# Patient Record
Sex: Male | Born: 1985 | Race: White | Hispanic: No | Marital: Married | Smoking: Never smoker
Health system: Southern US, Community
[De-identification: ages and names within clinical notes are randomized; demographics above are authoritative.]

## PROBLEM LIST (undated history)

## (undated) DIAGNOSIS — G40909 Epilepsy, unspecified, not intractable, without status epilepticus: Secondary | ICD-10-CM

## (undated) HISTORY — PX: WISDOM TOOTH EXTRACTION: SHX21

---

## 2003-10-04 ENCOUNTER — Other Ambulatory Visit: Payer: Self-pay

## 2005-02-09 ENCOUNTER — Emergency Department: Payer: Self-pay | Admitting: Emergency Medicine

## 2005-02-09 ENCOUNTER — Other Ambulatory Visit: Payer: Self-pay

## 2005-05-08 ENCOUNTER — Emergency Department: Payer: Self-pay | Admitting: Unknown Physician Specialty

## 2005-07-05 ENCOUNTER — Emergency Department: Payer: Self-pay | Admitting: Emergency Medicine

## 2005-08-14 ENCOUNTER — Emergency Department: Payer: Self-pay | Admitting: Emergency Medicine

## 2005-09-19 ENCOUNTER — Emergency Department: Payer: Self-pay | Admitting: Emergency Medicine

## 2005-10-25 ENCOUNTER — Emergency Department: Payer: Self-pay | Admitting: Emergency Medicine

## 2006-02-09 ENCOUNTER — Emergency Department: Payer: Self-pay | Admitting: Emergency Medicine

## 2006-03-23 ENCOUNTER — Emergency Department: Payer: Self-pay | Admitting: Emergency Medicine

## 2006-05-08 ENCOUNTER — Emergency Department: Payer: Self-pay | Admitting: Emergency Medicine

## 2006-05-23 ENCOUNTER — Emergency Department: Payer: Self-pay | Admitting: Emergency Medicine

## 2006-10-02 ENCOUNTER — Emergency Department: Payer: Self-pay | Admitting: Emergency Medicine

## 2007-06-05 ENCOUNTER — Emergency Department: Payer: Self-pay | Admitting: Emergency Medicine

## 2007-11-18 ENCOUNTER — Emergency Department: Payer: Self-pay | Admitting: Emergency Medicine

## 2008-09-18 ENCOUNTER — Emergency Department: Payer: Self-pay | Admitting: Emergency Medicine

## 2010-08-02 ENCOUNTER — Emergency Department: Payer: Self-pay | Admitting: Emergency Medicine

## 2011-02-01 ENCOUNTER — Emergency Department: Payer: Self-pay | Admitting: Emergency Medicine

## 2013-07-09 ENCOUNTER — Emergency Department (HOSPITAL_COMMUNITY): Payer: Medicaid Other

## 2013-07-09 ENCOUNTER — Emergency Department: Payer: Self-pay | Admitting: Internal Medicine

## 2013-07-09 ENCOUNTER — Emergency Department (HOSPITAL_COMMUNITY)
Admission: EM | Admit: 2013-07-09 | Discharge: 2013-07-10 | Disposition: A | Discharge status: Home / Self Care | Payer: Medicaid Other | Attending: Emergency Medicine | Admitting: Emergency Medicine

## 2013-07-09 ENCOUNTER — Encounter (HOSPITAL_COMMUNITY): Payer: Self-pay | Admitting: Emergency Medicine

## 2013-07-09 DIAGNOSIS — R590 Localized enlarged lymph nodes: Secondary | ICD-10-CM

## 2013-07-09 DIAGNOSIS — R0789 Other chest pain: Secondary | ICD-10-CM | POA: Insufficient documentation

## 2013-07-09 DIAGNOSIS — R599 Enlarged lymph nodes, unspecified: Secondary | ICD-10-CM | POA: Insufficient documentation

## 2013-07-09 DIAGNOSIS — J111 Influenza due to unidentified influenza virus with other respiratory manifestations: Secondary | ICD-10-CM

## 2013-07-09 DIAGNOSIS — R69 Illness, unspecified: Secondary | ICD-10-CM

## 2013-07-09 DIAGNOSIS — R0602 Shortness of breath: Secondary | ICD-10-CM | POA: Insufficient documentation

## 2013-07-09 DIAGNOSIS — H9209 Otalgia, unspecified ear: Secondary | ICD-10-CM | POA: Insufficient documentation

## 2013-07-09 DIAGNOSIS — R062 Wheezing: Secondary | ICD-10-CM | POA: Insufficient documentation

## 2013-07-09 LAB — CBC WITH DIFFERENTIAL/PLATELET
Basophils Absolute: 0 10*3/uL (ref 0.0–0.1)
Basophils Relative: 0 % (ref 0–1)
Eosinophils Absolute: 0 10*3/uL (ref 0.0–0.7)
Eosinophils Relative: 1 % (ref 0–5)
HEMATOCRIT: 39.8 % (ref 39.0–52.0)
Hemoglobin: 14.2 g/dL (ref 13.0–17.0)
LYMPHS PCT: 13 % (ref 12–46)
Lymphs Abs: 1 10*3/uL (ref 0.7–4.0)
MCH: 31.9 pg (ref 26.0–34.0)
MCHC: 35.7 g/dL (ref 30.0–36.0)
MCV: 89.4 fL (ref 78.0–100.0)
MONO ABS: 0.9 10*3/uL (ref 0.1–1.0)
Monocytes Relative: 11 % (ref 3–12)
NEUTROS ABS: 5.9 10*3/uL (ref 1.7–7.7)
NEUTROS PCT: 75 % (ref 43–77)
Platelets: 166 10*3/uL (ref 150–400)
RBC: 4.45 MIL/uL (ref 4.22–5.81)
RDW: 12.7 % (ref 11.5–15.5)
WBC: 7.8 10*3/uL (ref 4.0–10.5)

## 2013-07-09 LAB — BASIC METABOLIC PANEL
BUN: 10 mg/dL (ref 6–23)
CHLORIDE: 99 meq/L (ref 96–112)
CO2: 25 meq/L (ref 19–32)
CREATININE: 0.89 mg/dL (ref 0.50–1.35)
Calcium: 9.2 mg/dL (ref 8.4–10.5)
GFR calc non Af Amer: >90 mL/min (ref >90)
Glucose, Bld: 101 mg/dL — ABNORMAL HIGH (ref 70–99)
Potassium: 3.9 mEq/L (ref 3.7–5.3)
Sodium: 137 mEq/L (ref 137–147)

## 2013-07-09 LAB — RAPID STREP SCREEN (MED CTR MEBANE ONLY): STREPTOCOCCUS, GROUP A SCREEN (DIRECT): NEGATIVE

## 2013-07-09 MED ORDER — ONDANSETRON HCL 4 MG/2ML IJ SOLN
4.0000 mg | Freq: Once | INTRAMUSCULAR | Status: AC
  Administered 2013-07-09: 4 mg via INTRAVENOUS
  Filled 2013-07-09: qty 2

## 2013-07-09 MED ORDER — IBUPROFEN 400 MG PO TABS
800.0000 mg | ORAL_TABLET | Freq: Once | ORAL | Status: AC
  Administered 2013-07-09: 800 mg via ORAL
  Filled 2013-07-09: qty 2

## 2013-07-09 MED ORDER — SODIUM CHLORIDE 0.9 % IV BOLUS (SEPSIS)
2000.0000 mL | Freq: Once | INTRAVENOUS | Status: AC
  Administered 2013-07-09: 2000 mL via INTRAVENOUS

## 2013-07-09 NOTE — ED Provider Notes (Signed)
CSN: [id number]     Arrival date & time 07/09/13  [id number] History   This chart was scribed for non-physician practitioner Dierdre ForthHannah Tais Koestner, PA-C working with Flint MelterElliott L Wentz, MD by Donne Anonayla Curran, ED Scribe. This patient was seen in room TR07C/TR07C and the patient's care was started at 2119.   First MD Initiated Contact with Patient 07/09/13 2119     Chief Complaint  Patient presents with  . swollen lymph nodes neck    The history is provided by the patient. No language interpreter was used.   HPI Comments: Chris Hart is a 28 y.o. male who presents to the Emergency Department complaining of 6 days of influenza-like illness and gradually swelling lymph nodes in his neck that worsened today. He was seen at [facility] this morning and states that they did not do anything there. He reports associated cough, congestion, fever (102), ear pain, pain with swallowing, mild dizziness, and nausea.  He denies vomiting, diarrhea, syncope, dysuria.  He has tried ibuprofen and Tylenol with moderate relief. He denies sore throat, vomiting, or any other pain. He denies exposure to sick individuals. He did not receive a flu shot this year. He denies any other medical conditions.  History reviewed. No pertinent past medical history. History reviewed. No pertinent past surgical history. No family history on file. History  Substance Use Topics  . Smoking status: Never Smoker   . Smokeless tobacco: Not on file  . Alcohol Use: Yes    Review of Systems  Constitutional: Positive for fatigue. Negative for fever, chills and appetite change.  HENT: Positive for congestion, ear pain, postnasal drip, rhinorrhea and sinus pressure. Negative for ear discharge, mouth sores and sore throat.   Eyes: Negative for visual disturbance.  Respiratory: Positive for cough, chest tightness, shortness of breath and wheezing. Negative for stridor.   Cardiovascular: Negative for chest pain, palpitations and leg  swelling.  Gastrointestinal: Positive for nausea. Negative for vomiting, abdominal pain and diarrhea.  Genitourinary: Negative for dysuria, urgency, frequency and hematuria.  Musculoskeletal: Negative for arthralgias, back pain, myalgias and neck stiffness.  Skin: Negative for rash.  Neurological: Positive for dizziness and headaches. Negative for syncope, light-headedness and numbness.  Hematological: Negative for adenopathy.  Psychiatric/Behavioral: The patient is not nervous/anxious.   All other systems reviewed and are negative.    Allergies  Bactrim  Home Medications   Current Outpatient Rx  Name  Route  Sig  Dispense  Refill  . acetaminophen (TYLENOL) 325 MG tablet   Oral   Take 650 mg by mouth every 6 (six) hours as needed for moderate pain.         Marland Kitchen. DM-Doxylamine-Acetaminophen 15-6.25-325 MG/15ML LIQD   Oral   Take 30 mLs by mouth daily as needed (for cold).         Marland Kitchen. ibuprofen (ADVIL,MOTRIN) 200 MG tablet   Oral   Take 200 mg by mouth every 6 (six) hours as needed for moderate pain.         Marland Kitchen. HYDROcodone-homatropine (HYCODAN) 5-1.5 MG/5ML syrup   Oral   Take 5 mLs by mouth every 6 (six) hours as needed for cough.   120 mL   0     BP 124/78  Pulse 85  Temp(Src) 98.6 F (37 C) (Oral)  Resp 20  Ht 6\' 1"  (1.854 m)  Wt 268 lb (121.564 kg)  BMI 35.37 kg/m2  SpO2 100%  Physical Exam  Nursing note and vitals reviewed. Constitutional: He is oriented to  person, place, and time. He appears well-developed and well-nourished. No distress.  HENT:  Head: Normocephalic and atraumatic.  Right Ear: Tympanic membrane, external ear and ear canal normal.  Left Ear: Tympanic membrane, external ear and ear canal normal.  Nose: Mucosal edema and rhinorrhea present. No epistaxis. Right sinus exhibits no maxillary sinus tenderness and no frontal sinus tenderness. Left sinus exhibits no maxillary sinus tenderness and no frontal sinus tenderness.  Mouth/Throat: Uvula is  midline and mucous membranes are normal. Mucous membranes are not pale and not cyanotic. No oropharyngeal exudate, posterior oropharyngeal edema, posterior oropharyngeal erythema or tonsillar abscesses.  TM's are erythematous, non bulging, no effusion. Tonsils enlarged, erythematous with open cripes but no exudate No tenderness to mastoid No tenderness to parotid glands Sublingual area soft without induration   Eyes: Conjunctivae are normal. Pupils are equal, round, and reactive to light.  Neck: Normal range of motion and full passive range of motion without pain.  No midline or paraspinal C spine tenderness  Cardiovascular: Normal rate, normal heart sounds and intact distal pulses.   No murmur heard. Regular rhythm, tachycardia, no murmur  Pulmonary/Chest: Effort normal and breath sounds normal. No stridor.  Clear and equal breath sounds  Abdominal: Soft. Bowel sounds are normal. He exhibits no distension and no mass. There is no tenderness. There is no rebound and no guarding.  Abdomen soft and nontender without palpable mass or bruit  Musculoskeletal: Normal range of motion.  Lymphadenopathy:       Head (right side): Submandibular, tonsillar and preauricular adenopathy present. No submental, no posterior auricular and no occipital adenopathy present.       Head (left side): Submandibular, tonsillar and preauricular adenopathy present. No submental, no posterior auricular and no occipital adenopathy present.    He has cervical adenopathy.       Right cervical: Superficial cervical adenopathy present. No deep cervical and no posterior cervical adenopathy present.      Left cervical: Superficial cervical adenopathy present. No deep cervical and no posterior cervical adenopathy present.    He has no axillary adenopathy.       Right: No supraclavicular adenopathy present.       Left: No supraclavicular adenopathy present.  Patient with significant lymphadenopathy in the submandibular,  tonsillar and preauricular lymph nodes Mild lymphadenopathy of the superficial cervical but no lymphadenopathy of the deep or posterior cervical No axillary or supraclavicular lymphadenopathy  Neurological: He is alert and oriented to person, place, and time.  Skin: Skin is warm and dry. No rash noted. He is not diaphoretic. No erythema.  Psychiatric: He has a normal mood and affect. His behavior is normal.    ED Course  Procedures (including critical care time)  DIAGNOSTIC STUDIES: Oxygen Saturation is 100% on RA, normal by my interpretation.    COORDINATION OF CARE: 9:34 PM Discussed treatment plan which includes consultation with Dr. Effie Shy with pt at bedside and pt agreed to plan.   9:44 PM Case discussed with Dr. Effie Shy, who recommended lymphoma workup.   Labs Review Labs Reviewed  BASIC METABOLIC PANEL - Abnormal; Notable for the following:    Glucose, Bld 101 (*)    All other components within normal limits  RAPID STREP SCREEN  CULTURE, GROUP A STREP  CBC WITH DIFFERENTIAL   Imaging Review Dg Chest 2 View  07/09/2013   CLINICAL DATA:  Cough  EXAM: CHEST  2 VIEW  COMPARISON:  None.  FINDINGS: Normal heart size and mediastinal contours. No acute infiltrate or  edema. No effusion or pneumothorax. No acute osseous findings.  IMPRESSION: Negative for pneumonia.   Electronically Signed   By: Tiburcio Pea M.D.   On: 07/09/2013 23:23    EKG Interpretation   None       MDM   1. Reactive cervical lymphadenopathy   2. Influenza-like illness      Orvel Cutsforth presents with influenza like illness and cervical adenopathy.  Patient with symptoms consistent with influenza.  Vitals are stable, fever. Concern for mild dehydration, but tolerating PO's.  Lungs are clear. Patient with tachycardia and not discrete lymphadenopathy.  Likely related to patient's viral illness but concern for lymphoma is present. Will obtain labs and chest x-ray.  Chest x-ray without evidence of  pneumonia, pneumothorax or pulmonary edema. Also of note patient does not have a widened mediastinum or hilar lymphadenopathy. CBC and CMP unremarkable and not indicative of lymphoma.  The patient understands that symptoms are greater than the recommended 24-48 hour window of treatment with Tamiflu.  Patient will be discharged with instructions to orally hydrate, rest, and use over-the-counter medications such as anti-inflammatories ibuprofen and Aleve for muscle aches and Tylenol for fever.  Patient will also be given a cough suppressant.   It has been determined that no acute conditions requiring further emergency intervention are present at this time. The patient/guardian have been advised of the diagnosis and plan. We have discussed signs and symptoms that warrant return to the ED, such as changes or worsening in symptoms.   Vital signs are stable at discharge.   BP 124/78  Pulse 85  Temp(Src) 98.6 F (37 C) (Oral)  Resp 20  Ht 6\' 1"  (1.854 m)  Wt 268 lb (121.564 kg)  BMI 35.37 kg/m2  SpO2 100%  Patient/guardian has voiced understanding and agreed to follow-up with the PCP or specialist.   I personally performed the services described in this documentation, which was scribed in my presence. The recorded information has been reviewed and is accurate.     Dahlia Client Aireal Slater, PA-C 07/10/13 [phone]

## 2013-07-09 NOTE — ED Notes (Signed)
X-ray called to inform of patient availability.

## 2013-07-09 NOTE — ED Notes (Signed)
The pt has had lymph nodes swollen in his neck since yesterday.  He was seen at [facility] earlier today and he reports that they di not do anything there.  Sl temp sleeping more than usual.  He had a cold last week

## 2013-07-09 NOTE — ED Notes (Signed)
IV access attempted x2 without success. Felipa Etherrence, RN asked to attempt IV access.

## 2013-07-10 MED ORDER — HYDROCODONE-HOMATROPINE 5-1.5 MG/5ML PO SYRP: 5.0000 mL | ORAL_SOLUTION | Freq: Four times a day (QID) | ORAL | Status: DC | PRN

## 2013-07-10 NOTE — Discharge Instructions (Signed)
1. Medications: mucinex, hycodan, usual home medications 2. Treatment: rest, drink plenty of fluids, take tylenol or ibuprofen for fever control 3. Follow Up: Please followup with your primary doctor for discussion of your diagnoses and further evaluation after today's visit; if you do not have a primary care doctor use the resource guide provided to find one;     Read the instructions below on reasons to return to the emergency department and to learn more about your diagnosis.  Use over the counter medications for symptomatic relief as we discussed (musinex as a decongestant, Tylenol for fever/pain, Motrin/Ibuprofen for muscle aches). If prescribed a cough suppressant during your visit, do not operate heavy machinery with in 5 hours of taking this medication. Followup with your primary care doctor in 4 days if your symptoms persist.  Your more than welcome to return to the emergency department if symptoms worsen or become concerning.  Influenza, Adult Influenza ("the flu") is a viral infection of the respiratory tract. It occurs more often in winter months because people spend more time in close contact with one another. Influenza can make you feel very sick. Influenza easily spreads from person to person (contagious). CAUSES  Influenza is caused by a virus that infects the respiratory tract. You can catch the virus by breathing in droplets from an infected person's cough or sneeze. You can also catch the virus by touching something that was recently contaminated with the virus and then touching your mouth, nose, or eyes. SYMPTOMS  Symptoms typically last 4 to 10 days and may include:  Fever.  Chills.  Headache, body aches, and muscle aches.  Sore throat.  Chest discomfort and cough.  Poor appetite.  Weakness or feeling tired.  Dizziness.  Nausea or vomiting. DIAGNOSIS  Diagnosis of influenza is often made based on your history and a physical exam. A nose or throat swab test can be  done to confirm the diagnosis. RISKS AND COMPLICATIONS You may be at risk for a more severe case of influenza if you smoke cigarettes, have diabetes, have chronic heart disease (such as heart failure) or lung disease (such as asthma), or if you have a weakened immune system. Elderly people and pregnant women are also at risk for more serious infections. The most common complication of influenza is a lung infection (pneumonia). Sometimes, this complication can require emergency medical care and may be life-threatening. PREVENTION  An annual influenza vaccination (flu shot) is the best way to avoid getting influenza. An annual flu shot is now routinely recommended for all adults in the [place] TREATMENT  In mild cases, influenza goes away on its own. Treatment is directed at relieving symptoms. For more severe cases, your caregiver may prescribe antiviral medicines to shorten the sickness. Antibiotic medicines are not effective, because the infection is caused by a virus, not by bacteria. HOME CARE INSTRUCTIONS  Only take over-the-counter or prescription medicines for pain, discomfort, or fever as directed by your caregiver.  Use a cool mist humidifier to make breathing easier.  Get plenty of rest until your temperature returns to normal. This usually takes 3 to 4 days.  Drink enough fluids to keep your urine clear or pale yellow.  Cover your mouth and nose when coughing or sneezing, and wash your hands well to avoid spreading the virus.  Stay home from work or school until your fever has been gone for at least 1 full day. SEEK MEDICAL CARE IF:   You have chest pain or a deep cough that  worsens or produces more mucus.  You have nausea, vomiting, or diarrhea. SEEK IMMEDIATE MEDICAL CARE IF:   You have difficulty breathing, shortness of breath, or your skin or nails turn bluish.  You have severe neck pain or stiffness.  You have a severe headache, facial pain, or earache.  You have a  worsening or recurring fever.  You have nausea or vomiting that cannot be controlled. MAKE SURE YOU:  Understand these instructions.  Will watch your condition.  Will get help right away if you are not doing well or get worse. Document Released: 06/09/2000 Document Revised: 12/12/2011 Document Reviewed: 09/11/2011 [facility] Patient Information 2014 [place], [place].   Emergency Department Resource Guide 1) Find a Doctor and Pay Out of Pocket Although you won't have to find out who is covered by your insurance plan, it is a good idea to ask around and get recommendations. You will then need to call the office and see if the doctor you have chosen will accept you as a new patient and what types of options they offer for patients who are self-pay. Some doctors offer discounts or will set up payment plans for their patients who do not have insurance, but you will need to ask so you aren't surprised when you get to your appointment.  2) Contact Your Local Health Department Not all health departments have doctors that can see patients for sick visits, but many do, so it is worth a call to see if yours does. If you don't know where your local health department is, you can check in your phone book. The CDC also has a tool to help you locate your state's health department, and many state websites also have listings of all of their local health departments.  3) Find a Walk-in Clinic If your illness is not likely to be very severe or complicated, you may want to try a walk in clinic. These are popping up all over the country in pharmacies, drugstores, and shopping centers. They're usually staffed by nurse practitioners or physician assistants that have been trained to treat common illnesses and complaints. They're usually fairly quick and inexpensive. However, if you have serious medical issues or chronic medical problems, these are probably not your best option.  No Primary Care Doctor: - Call Health  Connect at  [phone] - they can help you locate a primary care doctor that  accepts your insurance, provides certain services, etc. - Physician Referral Service- [phone]  Chronic Pain Problems: Organization         Address  Phone   Notes  [facility]  [phone] Patients need to be referred by their primary care doctor.   Medication Assistance: Organization         Address  Phone   Notes  [facility]ication [facility] [address] [place], [place] [id number] [phone] --Must be a resident of [place] [facility] [place] -- Must have NO insurance coverage whatsoever (no Medicaid/ Medicare, etc.) -- The pt. MUST have a primary care doctor that directs their care regularly and follows them in the community   MedAssist  [phone]   [employer]  [phone]    Agencies that provide inexpensive medical care: Organization         Address  Phone   Notes  [facility]  [phone]   [facility]    [phone]   [facility] [address] [place],  [place] [address] [phone]   [facility] [address], [place] [phone]   Planned Parenthood    [phone]   [facility]    [phone]   [facility] and [facility]  [address], [place] Phone:  [phone], Fax:  [phone] Hours of Operation:  9 am - 6 pm, M-F.  Also accepts Medicaid/Medicare and self-pay.  [facility]  [address], [place] Phone: [phone], Fax: [phone]. Hours of Operation:  8:30 am - 5:30 pm, M-F.  Also accepts Medicaid and self-pay.  [facility] [place] [address] [place] Phone: [phone]   [facility] [address] Natasha Bence [place], [place] [phone], Ext. [phone] Mondays & Thursdays: 7-9 AM.  First 15 patients are seen on a first come, first serve basis.     Medicaid-accepting [facility] Providers:  Organization         Address  Phone   Notes  [facility] [address], [place] [phone] Also accepts self-pay patients.  [facility] [address] Laurell Josephs [place], [place]  [phone]   [facility] [address], [place] [phone]   [facility] [address] [place] [phone]   Renaye Rakers [address], [place]   [phone] Only accepts [place] Access [place] patients after they have their name applied to their card.   Self-Pay (no insurance) in [facility]:  Organization         Address  Phone   Notes  Sickle Cell Patients, [facility] [address] [place], [place] [phone]   [facility] [address] [place], [place] [phone]   [facility] [place]  [address],  [phone]   [facility]/Dr. Osei-Bonsu  [address] [place] or [address], [place] [phone] Phone number for both [place] and [place] locations is the same.  [facility] and [facility] [address], [place] [phone]   [facility] [address] [place] or [address] [phone]   [facility] [address] [place] [phone], phone; [phone], fax Sees patients 1st and 3rd Saturday of every month.  Must not qualify for public or private insurance (i.e. Medicaid, Medicare, [place] Health Choice, Veterans' Benefits)  Household income should be no more than 200% of the poverty level The clinic cannot treat you if you are pregnant or think you are pregnant  Sexually transmitted diseases are not treated at the clinic.    Dental Care: Organization         Address  Phone  Notes  [facility]  Department of [facility] [address] [place], [place] [phone] Accepts children up to age 19 who are enrolled in [place] or [place] Health Choice; pregnant women with a Medicaid card; and children who have applied for Medicaid or [place] Health Choice, but were declined, whose parents can pay a reduced fee at time of service.  [facility] Department of [facility]  [address], [place] [phone] Accepts children up to age 20 who are enrolled in [place] or  Health Choice; pregnant  women with a Medicaid card; and children who have applied for Medicaid or [place] Health Choice, but were declined, whose parents can pay a reduced fee at time of service.  [place] [facility] Access PRO[facility]  [place] [phone] Patients are seen by appointment only. Walk-ins are not accepted. [place] will see patients 52 years of age and older. Monday - Tuesday (8am-5pm) Most Wednesdays (8:30-5pm) $30 per visit, cash only  [facility] Adult Dental Access PROGRAM  [address], [facility] [phone] Patients are seen by appointment only. Walk-ins are not accepted. [place] will see patients 43 years of age and older. One Wednesday Evening (Monthly: Volunteer Based).  $30 per visit, cash only  [place]  [phone] for adults; Children under age 61, call Graduate Pediatric Dentistry at [phone]. Children aged 48-14, please call [phone] to request a pediatric application.  Dental services are provided in all areas of dental care including fillings, crowns and bridges, complete and partial dentures, implants, gum treatment, root canals, and extractions. Preventive care is also provided. Treatment is provided to both adults and children. Patients are selected via a lottery and there is often a waiting list.   [facility] [address] [place]  [phone]  [email or web address]   [facility] [address] [place], [place] [phone], Ext. [phone] Second and Fourth Thursday of each month, opens at 6:30 AM; Clinic ends at 9 AM.  Patients are seen on a first-come first-served basis, and a limited number are seen during each clinic.   [facility]  [address] Hillard Danker [place], [place] [phone]   Eligibility Requirements You must have lived in [place], [place], or [place] counties for at least the last three months.   You cannot be eligible for state or federal sponsored [employer], including [employer], [place], or [employer].   You generally cannot be eligible for healthcare insurance through your employer.    How to apply: Eligibility screenings are held every Tuesday and Wednesday afternoon from 1:00 pm until 4:00 pm. You do not need an appointment for the interview!  [facility] [address], [place], [place]   [place]  [place] Department  [place]  [phone]    Behavioral Health Resources in the Community: Intensive Outpatient Programs Organization         Address  Phone  Notes  [place]. [address] [place], [place] [phone]   [facility] [address] [place], [place]   ADS: Alcohol & Drug Svcs [address] [place], [place]   [place] [address] [place] [address],  [place], [place] or [phone]   Substance Abuse Resources Organization         Address  Phone  Notes  Alcohol and Drug Services  [phone]   [place]  [phone]   The English   Chinita Pester  [phone]   Residential & Outpatient Substance Abuse Program  [phone]   Psychological Services Organization          Address  Phone  Notes  [facility]  [place]  [phone]   [place] [address], [place] or [phone]    Mobile Crisis  Teams Organization         Address  Phone  Notes  [facility], Mobile Crisis Care Unit  [phone]   [facility]  [address] [place], [place]   [facility] [address] [place] [phone]    Self-Help/Support Groups Organization         Address  Phone             Notes  [place]. of [place] - variety of support groups  [place] Call for more information  Narcotics Anonymous (NA), [facility] [address], [employer] [place]  2 meetings at this location   Special educational needs teacher         Address  Phone  Notes  [facility] [place],    [place]  [phone]   [facility]  [address], [place] [address]67289, [place], [place]   [place], [place] [phone] Admissions: 8am-3pm M-F  Incentives Substance [place] [address],    [place], [place] [phone]   [facility] [address] [place], [place], [place]   [facility] [address]  [place], [place]   [facility] Programs - Intensive Outpatient [address] Kristeen Mans 1, [place], [place]   [facility] [employer] [employer] ([place].,  [place], [place] [phone] or [phone]   [facility] (RTS) [address] [place], [place] Accepts Medicaid  [facility] [address]  [place] [phone] Substance Abuse/Addiction Treatment   [facility] Organization         Address  Phone  Notes  [facility]  [phone]   Domenic Schwab, PhD [address] Arlis Porta [place], [place]   [phone] or [phone]   Marianne Oxnard [place], [place] [phone]   [facility] [address] [place], [place] [phone] Insurance/Medicaid/sponsorship through [facility] and Families [address]                                    [place], [place] [phone] [place] [address], [place] [phone]    Dr. Adele Schilder  [phone]   [facility] [place] Dept. 1) [address] [place] 2) [place] 3)  [place] [address], [place] [phone]  [phone]   [place] [phone] or [phone] (After Hours)

## 2013-07-11 LAB — CULTURE, GROUP A STREP

## 2013-07-15 NOTE — ED Provider Notes (Signed)
Medical screening examination/treatment/procedure(s) were performed by non-physician practitioner and as supervising physician I was immediately available for consultation/collaboration.  EKG Interpretation   None        Flint MelterElliott L Ellaree Gear, MD 07/15/13 [phone]

## 2015-02-19 ENCOUNTER — Emergency Department
Admission: EM | Admit: 2015-02-19 | Discharge: 2015-02-19 | Disposition: A | Discharge status: Home / Self Care | Payer: Medicaid Other | Attending: Emergency Medicine | Admitting: Emergency Medicine

## 2015-02-19 ENCOUNTER — Emergency Department: Payer: Medicaid Other

## 2015-02-19 ENCOUNTER — Encounter: Payer: Self-pay | Admitting: Emergency Medicine

## 2015-02-19 DIAGNOSIS — S99912A Unspecified injury of left ankle, initial encounter: Secondary | ICD-10-CM | POA: Diagnosis present

## 2015-02-19 DIAGNOSIS — Y998 Other external cause status: Secondary | ICD-10-CM | POA: Insufficient documentation

## 2015-02-19 DIAGNOSIS — S86012A Strain of left Achilles tendon, initial encounter: Secondary | ICD-10-CM | POA: Diagnosis not present

## 2015-02-19 DIAGNOSIS — X58XXXA Exposure to other specified factors, initial encounter: Secondary | ICD-10-CM | POA: Diagnosis not present

## 2015-02-19 DIAGNOSIS — M7732 Calcaneal spur, left foot: Secondary | ICD-10-CM

## 2015-02-19 DIAGNOSIS — Z79899 Other long term (current) drug therapy: Secondary | ICD-10-CM | POA: Insufficient documentation

## 2015-02-19 DIAGNOSIS — Y9289 Other specified places as the place of occurrence of the external cause: Secondary | ICD-10-CM | POA: Diagnosis not present

## 2015-02-19 DIAGNOSIS — Y9389 Activity, other specified: Secondary | ICD-10-CM | POA: Insufficient documentation

## 2015-02-19 MED ORDER — TRAMADOL HCL 50 MG PO TABS: 50.0000 mg | ORAL_TABLET | Freq: Four times a day (QID) | ORAL | Status: DC | PRN

## 2015-02-19 MED ORDER — NAPROXEN SODIUM 275 MG PO TABS: 275.0000 mg | ORAL_TABLET | Freq: Two times a day (BID) | ORAL | Status: AC

## 2015-02-19 NOTE — ED Provider Notes (Signed)
[facility] Emergency Department Provider Note  ____________________________________________  Time seen: Approximately 11:47 AM  I have reviewed the triage vital signs and the nursing notes.   HISTORY  Chief Complaint Ankle Pain    HPI Chris Hart is a 29 y.o. male patient complaining of 3 days of nontraumatic Achilles pain. States nose increased swelling and pain with dorsal and plantar flexion. No palliative measures taken for this complaint. Patient is rating his pain as a 7/10. Increases with ambulation. Patient describes pain as a dull to sharp.   History reviewed. No pertinent past medical history.  There are no active problems to display for this patient.   History reviewed. No pertinent past surgical history.  Current Outpatient Rx  Name  Route  Sig  Dispense  Refill  . acetaminophen (TYLENOL) 325 MG tablet   Oral   Take 650 mg by mouth every 6 (six) hours as needed for moderate pain.         Marland Kitchen DM-Doxylamine-Acetaminophen 15-6.25-325 MG/15ML LIQD   Oral   Take 30 mLs by mouth daily as needed (for cold).         Marland Kitchen HYDROcodone-homatropine (HYCODAN) 5-1.5 MG/5ML syrup   Oral   Take 5 mLs by mouth every 6 (six) hours as needed for cough.   120 mL   0   . ibuprofen (ADVIL,MOTRIN) 200 MG tablet   Oral   Take 200 mg by mouth every 6 (six) hours as needed for moderate pain.           Allergies Bactrim  No family history on file.  Social History Social History  Substance Use Topics  . Smoking status: Never Smoker   . Smokeless tobacco: None  . Alcohol Use: Yes    Review of Systems Constitutional: No fever/chills Eyes: No visual changes. ENT: No sore throat. Cardiovascular: Denies chest pain. Respiratory: Denies shortness of breath. Gastrointestinal: No abdominal pain.  No nausea, no vomiting.  No diarrhea.  No constipation. Genitourinary: Negative for dysuria. Musculoskeletal: Left Achilles pain.  Skin:  Negative for rash. Neurological: Negative for headaches, focal weakness or numbness. Allergic/Immunilogical: Bactrim 10-point ROS otherwise negative.  ____________________________________________   PHYSICAL EXAM:  VITAL SIGNS: ED Triage Vitals  Enc Vitals Group     BP --      Pulse --      Resp --      Temp --      Temp src --      SpO2 --      Weight --      Height --      Head Cir --      Peak Flow --      Pain Score 02/19/15 1147 7     Pain Loc --      Pain Edu? --      Excl. in GC? --     Constitutional: Alert and oriented. Well appearing and in no acute distress. Eyes: Conjunctivae are normal. PERRL. EOMI. Head: Atraumatic. Nose: No congestion/rhinnorhea. Mouth/Throat: Mucous membranes are moist.  Oropharynx non-erythematous. Neck: No stridor. No cervical spine tenderness to palpation. Hematological/Lymphatic/Immunilogical: No cervical lymphadenopathy. Cardiovascular: Normal rate, regular rhythm. Grossly normal heart sounds.  Good peripheral circulation. Respiratory: Normal respiratory effort.  No retractions. Lungs CTAB. Gastrointestinal: Soft and nontender. No distention. No abdominal bruits. No CVA tenderness.  Musculoskeletal: No deformity or erythema to the ankle. Patient does have flat feet. Patient has some mild edema to the insertion point of the Achilles tendon.  Patient has full nuchal range of motion of guarding with dorsal flexion.  Neurologic:  Normal speech and language. No gross focal neurologic deficits are appreciated. No gait instability. Skin:  Skin is warm, dry and intact. No rash noted. Psychiatric: Mood and affect are normal. Speech and behavior are normal.  ____________________________________________   LABS (all labs ordered are listed, but only abnormal results are displayed)  Labs Reviewed - No data to display ____________________________________________  EKG   ____________________________________________  RADIOLOGY  X-ray was  unremarkable except for small heel spur. I, Joni Reining, personally viewed and evaluated these images (plain radiographs) as part of my medical decision making.   ____________________________________________   PROCEDURES  Procedure(s) performed: None  Critical Care performed: No  ____________________________________________   INITIAL IMPRESSION / ASSESSMENT AND PLAN / ED COURSE  Pertinent labs & imaging results that were available during my care of the patient were reviewed by me and considered in my medical decision making (see chart for details).  Left Achilles strain and small heel spur secondary Pes Planus. Discussed x-ray findings with patient. Patient given a prescription for naproxen and tramadol. Advised patient to [facility] if condition persists. ___________________________________________   FINAL CLINICAL IMPRESSION(S) / ED DIAGNOSES  Final diagnoses:  Strain of left Achilles tendon, initial encounter  Heel spur, left      Joni Reining, PA-C 02/19/15 1240  Darien Ramus, MD 02/19/15 [phone]

## 2015-02-19 NOTE — ED Notes (Signed)
Patient comes into the ED c/o left ankle pain.  Denies injury to the ankle.  Noticed it gradually has been getting worse with pain and swelling.

## 2015-02-19 NOTE — Discharge Instructions (Signed)
Take medications as directed.  Consider follow up with Podiatry Clinic.

## 2015-07-07 ENCOUNTER — Emergency Department
Admission: EM | Admit: 2015-07-07 | Discharge: 2015-07-07 | Disposition: A | Discharge status: Home / Self Care | Payer: Medicaid Other | Attending: Emergency Medicine | Admitting: Emergency Medicine

## 2015-07-07 ENCOUNTER — Encounter: Payer: Self-pay | Admitting: Emergency Medicine

## 2015-07-07 DIAGNOSIS — B349 Viral infection, unspecified: Secondary | ICD-10-CM

## 2015-07-07 DIAGNOSIS — Z791 Long term (current) use of non-steroidal anti-inflammatories (NSAID): Secondary | ICD-10-CM | POA: Insufficient documentation

## 2015-07-07 MED ORDER — DIPHENOXYLATE-ATROPINE 2.5-0.025 MG PO TABS: 1.0000 | ORAL_TABLET | Freq: Four times a day (QID) | ORAL | Status: AC | PRN

## 2015-07-07 MED ORDER — OXYCODONE-ACETAMINOPHEN 5-325 MG PO TABS: 2.0000 | ORAL_TABLET | Freq: Once | ORAL | Status: DC

## 2015-07-07 MED ORDER — DIPHENOXYLATE-ATROPINE 2.5-0.025 MG PO TABS
2.0000 | ORAL_TABLET | Freq: Once | ORAL | Status: AC
  Administered 2015-07-07: 2 via ORAL
  Filled 2015-07-07: qty 2

## 2015-07-07 NOTE — ED Notes (Signed)
Pt c/o diarrhea starting last night with decreased appetite. Also c/o bodyaches and chills. Denies any vomiting.

## 2015-07-07 NOTE — ED Provider Notes (Signed)
[facility] Emergency Department Provider Note  ____________________________________________  Time seen: Approximately 8:26 AM  I have reviewed the triage vital signs and the nursing notes.   HISTORY  Chief Complaint Diarrhea    HPI Chris Hart is a 30 y.o. male patient complaining like symptoms consistent mostly of diarrhea and body aches. Patient states filled nausea but no vomiting. Patient denies headache, nasal congestion, sore throat, or cough. Patient stated  4 episodes of loose stools since last night. No palliative measures taken for this complaint. Patient denies any pain but complaining of malaise.   History reviewed. No pertinent past medical history.  There are no active problems to display for this patient.   History reviewed. No pertinent past surgical history.  Current Outpatient Rx  Name  Route  Sig  Dispense  Refill  . acetaminophen (TYLENOL) 325 MG tablet   Oral   Take 650 mg by mouth every 6 (six) hours as needed for moderate pain.         . diphenoxylate-atropine (LOMOTIL) 2.5-0.025 MG tablet   Oral   Take 1 tablet by mouth 4 (four) times daily as needed for diarrhea or loose stools.   16 tablet   0   . DM-Doxylamine-Acetaminophen 15-6.25-325 MG/15ML LIQD   Oral   Take 30 mLs by mouth daily as needed (for cold).         Marland Kitchen. HYDROcodone-homatropine (HYCODAN) 5-1.5 MG/5ML syrup   Oral   Take 5 mLs by mouth every 6 (six) hours as needed for cough.   120 mL   0   . ibuprofen (ADVIL,MOTRIN) 200 MG tablet   Oral   Take 200 mg by mouth every 6 (six) hours as needed for moderate pain.         . naproxen sodium (ANAPROX) 275 MG tablet   Oral   Take 1 tablet (275 mg total) by mouth 2 (two) times daily with a meal.   30 tablet   2   . traMADol (ULTRAM) 50 MG tablet   Oral   Take 1 tablet (50 mg total) by mouth every 6 (six) hours as needed for moderate pain.   12 tablet   0     Allergies Bactrim  No  family history on file.  Social History Social History  Substance Use Topics  . Smoking status: Never Smoker   . Smokeless tobacco: None  . Alcohol Use: Yes    Review of Systems Constitutional: No fever/chills Eyes: No visual changes. ENT: No sore throat. Cardiovascular: Denies chest pain. Respiratory: Denies shortness of breath. Gastrointestinal: No abdominal pain.  Nausea, no vomiting.  Area.  No constipation. Genitourinary: Negative for dysuria. Musculoskeletal: Negative for back pain. Skin: Negative for rash. Neurological: Negative for headaches, focal weakness or numbness. Allergic/Immunilogical: Bactrim  10-point ROS otherwise negative.  ____________________________________________   PHYSICAL EXAM:  VITAL SIGNS: ED Triage Vitals  Enc Vitals Group     BP 07/07/15 0824 134/78 mmHg     Pulse Rate 07/07/15 0824 101     Resp 07/07/15 0824 20     Temp 07/07/15 0824 97.9 F (36.6 C)     Temp Source 07/07/15 0824 Oral     SpO2 07/07/15 0824 96 %     Weight 07/07/15 0824 265 lb (120.203 kg)     Height 07/07/15 0824 6\' 1"  (1.854 m)     Head Cir --      Peak Flow --      Pain Score --  Pain Loc --      Pain Edu? --      Excl. in GC? --     Constitutional: Alert and oriented. Well appearing and in no acute distress. Eyes: Conjunctivae are normal. PERRL. EOMI. Head: Atraumatic. Nose: No congestion/rhinnorhea. Mouth/Throat: Mucous membranes are moist.  Oropharynx non-erythematous. Neck: No stridor.  No cervical spine tenderness to palpation Hematological/Lymphatic/Immunilogical: No cervical lymphadenopathy. Cardiovascular: Normal rate, regular rhythm. Grossly normal heart sounds.  Good peripheral circulation. Respiratory: Normal respiratory effort.  No retractions. Lungs CTAB. Gastrointestinal: Soft and nontender. No distention. No abdominal bruits. No CVA tenderness. Hyperactive bowel sounds Musculoskeletal: No lower extremity tenderness nor edema.  No joint  effusions. Neurologic:  Normal speech and language. No gross focal neurologic deficits are appreciated. No gait instability. Skin:  Skin is warm, dry and intact. No rash noted. Psychiatric: Mood and affect are normal. Speech and behavior are normal.  ____________________________________________   LABS (all labs ordered are listed, but only abnormal results are displayed)  Labs Reviewed - No data to display ____________________________________________  EKG   ____________________________________________  RADIOLOGY   ____________________________________________   PROCEDURES  Procedure(s) performed: None  Critical Care performed: No  ____________________________________________   INITIAL IMPRESSION / ASSESSMENT AND PLAN / ED COURSE  Pertinent labs & imaging results that were available during my care of the patient were reviewed by me and considered in my medical decision making (see chart for details).  Viral illness. Patient given discharge care instructions. Work excuse for 24 hours. [facility]. ____________________________________________   FINAL CLINICAL IMPRESSION(S) / ED DIAGNOSES  Final diagnoses:  Viral illness      Joni Reining, PA-C 07/07/15 0850  Joni Reining, PA-C 07/07/15 1610  Myrna Blazer, MD 07/07/15 1504

## 2016-07-15 ENCOUNTER — Emergency Department
Admission: EM | Admit: 2016-07-15 | Discharge: 2016-07-15 | Disposition: A | Discharge status: Home / Self Care | Payer: Medicaid Other | Attending: Emergency Medicine | Admitting: Emergency Medicine

## 2016-07-15 ENCOUNTER — Encounter: Payer: Self-pay | Admitting: Emergency Medicine

## 2016-07-15 DIAGNOSIS — R040 Epistaxis: Secondary | ICD-10-CM | POA: Insufficient documentation

## 2016-07-15 MED ORDER — BUTALBITAL-APAP-CAFFEINE 50-325-40 MG PO TABS
1.0000 | ORAL_TABLET | Freq: Once | ORAL | Status: AC
  Administered 2016-07-15: 1 via ORAL
  Filled 2016-07-15: qty 1

## 2016-07-15 NOTE — ED Triage Notes (Signed)
Pt states had a headache with associated nosebleed earlier today. Pt states nosebleed has resolved but he continues to "have a little headache". Pt states headache is generalized. Pt appears in no acute distress.

## 2016-07-15 NOTE — Discharge Instructions (Signed)
Use Afrin nasal spray as needed 

## 2016-07-15 NOTE — ED Notes (Signed)
Pt. Going home with wife. 

## 2016-07-15 NOTE — ED Provider Notes (Signed)
[facility] Emergency Department Provider Note   ____________________________________________   None    (approximate)  I have reviewed the triage vital signs and the nursing notes.   HISTORY  Chief Complaint Epistaxis    HPI Chris Hart is a 31 y.o. male 's for evaluation of nosebleed today at 4 different occasions. States bleeding out of the right side of his nose. States that he is able to stop the bleeding was just concerned why he continues to bleed. Admits to having a warm dry car and house. Eyes any nasal or sinus congestion. Has an associated headache but has not taken medication for.   History reviewed. No pertinent past medical history.  There are no active problems to display for this patient.   No past surgical history on file.  Prior to Admission medications   Not on File    Allergies Bactrim [sulfamethoxazole-trimethoprim]  No family history on file.  Social History Social History  Substance Use Topics  . Smoking status: Never Smoker  . Smokeless tobacco: Never Used  . Alcohol use Yes    Review of Systems Constitutional: No fever/chills Eyes: No visual changes. ENT: No sore throat.Positive for recent nosebleed Cardiovascular: Denies chest pain. Respiratory: Denies shortness of breath. Musculoskeletal: Negative for back pain. Skin: Negative for rash. Neurological: Negative for headaches, focal weakness or numbness.  10-point ROS otherwise negative.  ____________________________________________   PHYSICAL EXAM:  VITAL SIGNS: ED Triage Vitals  Enc Vitals Group     BP 07/15/16 1939 127/79     Pulse Rate 07/15/16 1939 91     Resp 07/15/16 1939 16     Temp 07/15/16 1939 98.4 F (36.9 C)     Temp Source 07/15/16 1939 Oral     SpO2 07/15/16 1939 100 %     Weight --      Height 07/15/16 1940 6\' 1"  (1.854 m)     Head Circumference --      Peak Flow --      Pain Score 07/15/16 1940 4     Pain Loc --      Pain Edu? --      Excl. in GC? --     Constitutional: Alert and oriented. Well appearing and in no acute distress. Eyes: Conjunctivae are normal. PERRL. EOMI. Head: Atraumatic. Nose: No congestion/rhinnorhea.Evidence of previous bleed from the right anterior naris. No turbinate edema noted or active bleeding noted. Mouth/Throat: Mucous membranes are moist.  Oropharynx non-erythematous. Neck: No stridor.   Cardiovascular: Normal rate, regular rhythm. Grossly normal heart sounds.  Good peripheral circulation. Respiratory: Normal respiratory effort.  No retractions. Lungs CTAB. Neurologic:  Normal speech and language. No gross focal neurologic deficits are appreciated. No gait instability. Skin:  Skin is warm, dry and intact. No rash noted. Psychiatric: Mood and affect are normal. Speech and behavior are normal.  ____________________________________________   LABS (all labs ordered are listed, but only abnormal results are displayed)  Labs Reviewed - No data to display ____________________________________________  EKG   ____________________________________________  RADIOLOGY   ____________________________________________   PROCEDURES  Procedure(s) performed: None  Procedures  Critical Care performed: No  ____________________________________________   INITIAL IMPRESSION / ASSESSMENT AND PLAN / ED COURSE  Pertinent labs & imaging results that were available during my care of the patient were reviewed by me and considered in my medical decision making (see chart for details).  Acute epistaxis. Reassurance provided to the patient. Encouraged use of humidified air have her nasal spray  over-the-counter as needed. Patient instructed to return to ER with any worsening symptomology or uncontrolled bleeding.      ____________________________________________   FINAL CLINICAL IMPRESSION(S) / ED DIAGNOSES  Final diagnoses:  Epistaxis  Acute anterior epistaxis       NEW MEDICATIONS STARTED DURING THIS VISIT:  Discharge Medication List as of 07/15/2016  8:15 PM       Note:  This document was prepared using Dragon voice recognition software and may include unintentional dictation errors.   Evangeline Dakinharles M Lanijah Warzecha, PA-C 07/15/16 2041    Jene Everyobert Kinner, MD 07/15/16 [phone]

## 2016-07-21 ENCOUNTER — Encounter: Payer: Self-pay | Admitting: Emergency Medicine

## 2016-07-21 ENCOUNTER — Emergency Department
Admission: EM | Admit: 2016-07-21 | Discharge: 2016-07-21 | Disposition: A | Discharge status: Home / Self Care | Payer: Medicaid Other | Attending: Emergency Medicine | Admitting: Emergency Medicine

## 2016-07-21 DIAGNOSIS — R51 Headache: Secondary | ICD-10-CM | POA: Insufficient documentation

## 2016-07-21 DIAGNOSIS — J029 Acute pharyngitis, unspecified: Secondary | ICD-10-CM | POA: Insufficient documentation

## 2016-07-21 DIAGNOSIS — A084 Viral intestinal infection, unspecified: Secondary | ICD-10-CM | POA: Insufficient documentation

## 2016-07-21 MED ORDER — ONDANSETRON 4 MG PO TBDP: 4.0000 mg | ORAL_TABLET | Freq: Three times a day (TID) | ORAL | 0 refills | Status: DC | PRN

## 2016-07-21 NOTE — ED Provider Notes (Signed)
[facility] Emergency Department Provider Note   ____________________________________________   First MD Initiated Contact with Patient 07/21/16 [phone]     (approximate)  I have reviewed the triage vital signs and the nursing notes.   HISTORY  Chief Complaint Flu-like symptoms    HPI Chris Hart is a 31 y.o. male is here complaining of sore throat, intermittent headaches and diarrhea for the last several days. Patient states that he is taken over-the-counter ibuprofen or Advil with relief of his headaches. He states that his sore throat started yesterday but has improved. He also has periods of diarrhea but denies any vomiting. He states he has 2 daughters at home both with same symptoms however they will vomiting where he is not. Patient states that he did not take the flu shot this year. Patient continues to drink fluids without any difficulty. Patient states that he needs a note for work. Currently he rates his pain as a 4 out of 10.   History reviewed. No pertinent past medical history.  There are no active problems to display for this patient.   No past surgical history on file.  Prior to Admission medications   Medication Sig Start Date End Date Taking? Authorizing Provider  ondansetron (ZOFRAN ODT) 4 MG disintegrating tablet Take 1 tablet (4 mg total) by mouth every 8 (eight) hours as needed for nausea or vomiting. 07/21/16   Tommi Rumpshonda L Colleen Kotlarz, PA-C    Allergies Bactrim [sulfamethoxazole-trimethoprim]  No family history on file.  Social History Social History  Substance Use Topics  . Smoking status: Never Smoker  . Smokeless tobacco: Never Used  . Alcohol use Yes    Review of Systems Constitutional: No fever/chills Eyes: No visual changes. ENT: Positive sore throat. Cardiovascular: Denies chest pain. Respiratory: Denies shortness of breath. Gastrointestinal: No abdominal pain.  No nausea, no vomiting.  Positive diarrhea.   Negative constipation. Musculoskeletal: Negative for back pain. Skin: Negative for rash. Neurological: Positive for headaches, negative focal weakness or numbness.  10-point ROS otherwise negative.  ____________________________________________   PHYSICAL EXAM:  VITAL SIGNS: ED Triage Vitals  Enc Vitals Group     BP 07/21/16 0834 120/80     Pulse Rate 07/21/16 0834 63     Resp 07/21/16 0834 18     Temp 07/21/16 0834 98.2 F (36.8 C)     Temp Source 07/21/16 0834 Oral     SpO2 07/21/16 0834 100 %     Weight 07/21/16 0835 280 lb (127 kg)     Height 07/21/16 0835 6\' 1"  (1.854 m)     Head Circumference --      Peak Flow --      Pain Score 07/21/16 0835 4     Pain Loc --      Pain Edu? --      Excl. in GC? --     Constitutional: Alert and oriented. Well appearing and in no acute distress. Eyes: Conjunctivae are normal. PERRL. EOMI. Head: Atraumatic. Nose: No congestion/rhinnorhea. Mouth/Throat: Mucous membranes are moist.  Oropharynx non-erythematous. Neck: No stridor.   Cardiovascular: Normal rate, regular rhythm. Grossly normal heart sounds.  Good peripheral circulation. Respiratory: Normal respiratory effort.  No retractions. Lungs CTAB. Gastrointestinal: Soft and nontender. No distention. Bowel sounds are normoactive at this time. Musculoskeletal: His upper and lower extremities without any difficulty. Normal gait was noted. Neurologic:  Normal speech and language. No gross focal neurologic deficits are appreciated. No gait instability. Skin:  Skin is warm, dry  and intact. No rash noted. Psychiatric: Mood and affect are normal. Speech and behavior are normal.  ____________________________________________   LABS (all labs ordered are listed, but only abnormal results are displayed)  Labs Reviewed - No data to display  PROCEDURES  Procedure(s) performed: None  Procedures  Critical Care performed: No  ____________________________________________   INITIAL  IMPRESSION / ASSESSMENT AND PLAN / ED COURSE  Pertinent labs & imaging results that were available during my care of the patient were reviewed by me and considered in my medical decision making (see chart for details).  Patient was told to remain on clear liquids for the next 24 hours and slowly advance diet as tolerated. He was given a prescription for Zofran should he develop any vomiting. He was given a note to remain out of work for 2 days. He also was encouraged to take Tylenol if needed for fever or body aches. Follow-up with his PCP if any continued problems.    ___________________________________________   FINAL CLINICAL IMPRESSION(S) / ED DIAGNOSES  Final diagnoses:  Viral gastroenteritis      NEW MEDICATIONS STARTED DURING THIS VISIT:  Discharge Medication List as of 07/21/2016  9:27 AM    START taking these medications   Details  ondansetron (ZOFRAN ODT) 4 MG disintegrating tablet Take 1 tablet (4 mg total) by mouth every 8 (eight) hours as needed for nausea or vomiting., Starting Fri 07/21/2016, Print         Note:  This document was prepared using Dragon voice recognition software and may include unintentional dictation errors.    Tommi Rumps, PA-C 07/21/16 1035    Minna Antis, MD 07/21/16 [phone]

## 2016-07-21 NOTE — ED Notes (Signed)
Pt reports several days of intermittent headaches, sore throat which has improved since yesterday and periods of diarrhea. Denies any vomiting.  Pt has 2 daughters both who are at home sick vomiting.  Did not receive a flu shot this year. Pt states he has a hx of migraines, but headaches have not been as severe as a migraine.

## 2016-07-21 NOTE — ED Triage Notes (Signed)
Pt reports intermittent headaches, sore throat and diarrhea for a couple days. Pt ambulatory to triage, no apparent distress noted.

## 2016-07-21 NOTE — Discharge Instructions (Signed)
Clear liquids for the next 24 hours. Tylenol if needed for headache or fever. Zofran if needed for vomiting. Follow-up with your primary care doctor if any continued problems on Monday.

## 2016-07-23 ENCOUNTER — Emergency Department
Admission: EM | Admit: 2016-07-23 | Discharge: 2016-07-23 | Disposition: A | Discharge status: Home / Self Care | Payer: Medicaid Other | Attending: Emergency Medicine | Admitting: Emergency Medicine

## 2016-07-23 ENCOUNTER — Encounter: Payer: Self-pay | Admitting: Emergency Medicine

## 2016-07-23 DIAGNOSIS — J111 Influenza due to unidentified influenza virus with other respiratory manifestations: Secondary | ICD-10-CM | POA: Insufficient documentation

## 2016-07-23 DIAGNOSIS — Z79899 Other long term (current) drug therapy: Secondary | ICD-10-CM | POA: Insufficient documentation

## 2016-07-23 MED ORDER — BENZONATATE 100 MG PO CAPS: 100.0000 mg | ORAL_CAPSULE | Freq: Three times a day (TID) | ORAL | 0 refills | Status: AC | PRN

## 2016-07-23 NOTE — ED Notes (Signed)
See triage note  States he was seen on Friday for "stomach bug"  developed headache fever and n/v this am

## 2016-07-23 NOTE — ED Provider Notes (Signed)
[facility] Emergency Department Provider Note  ____________________________________________  Time seen: Approximately 8:11 PM  I have reviewed the triage vital signs and the nursing notes.   HISTORY  Chief Complaint Diarrhea and Headache    HPI Chris Hart is a 31 y.o. male presenting to the emergency department with headache, congestion, rhinorrhea, malaise, diarrhea and vomiting for the past four days. Patient states that he has had chills and sweats but he has not evaluated his temperature. Patient has family members with similar symptoms. He is tolerating fluids by mouth. However, he has experienced a diminished appetite. No recent travel. Patient works for the [employer].   History reviewed. No pertinent past medical history.  There are no active problems to display for this patient.   History reviewed. No pertinent surgical history.  Prior to Admission medications   Medication Sig Start Date End Date Taking? Authorizing Provider  benzonatate (TESSALON PERLES) 100 MG capsule Take 1 capsule (100 mg total) by mouth 3 (three) times daily as needed for cough. 07/23/16 07/28/16  Orvil Feil, PA-C  ondansetron (ZOFRAN ODT) 4 MG disintegrating tablet Take 1 tablet (4 mg total) by mouth every 8 (eight) hours as needed for nausea or vomiting. 07/21/16   Tommi Rumps, PA-C    Allergies Bactrim [sulfamethoxazole-trimethoprim]  No family history on file.  Social History Social History  Substance Use Topics  . Smoking status: Never Smoker  . Smokeless tobacco: Never Used  . Alcohol use Yes     Review of Systems  Constitutional: Patient has had chills/sweats Eyes: No visual changes. No discharge ENT: Patient has had congestion.  Cardiovascular: no chest pain. Respiratory: Patient has had non-productive cough.  No SOB. Gastrointestinal: Patient has had nausea.  Genitourinary: Negative for dysuria. No hematuria Musculoskeletal: Patient  has had myalgias. Skin: Negative for rash, abrasions, lacerations, ecchymosis. Neurological: Patient has headache, no focal weakness or numbness.   ____________________________________________   PHYSICAL EXAM:  VITAL SIGNS: ED Triage Vitals [07/23/16 1318]  Enc Vitals Group     BP 120/74     Pulse Rate 82     Resp 18     Temp 98.4 F (36.9 C)     Temp Source Oral     SpO2 96 %     Weight 280 lb (127 kg)     Height 6\' 1"  (1.854 m)     Head Circumference      Peak Flow      Pain Score 4     Pain Loc      Pain Edu?      Excl. in GC?     Constitutional: Alert and oriented. Patient is lying supine in bed.  Eyes: Conjunctivae are normal. PERRL. EOMI. Head: Atraumatic. ENT:      Ears: Tympanic membranes are injected bilaterally without evidence of effusion or purulent exudate. Bony landmarks are visualized bilaterally. No pain with palpation at the tragus.      Nose: Nasal turbinates are edematous and erythematous. Copious rhinorrhea visualized.      Mouth/Throat: Mucous membranes are moist. Posterior pharynx is mildly erythematous. No tonsillar hypertrophy or purulent exudate. Uvula is midline. Neck: Full range of motion. No pain is elicited with flexion at the neck. Hematological/Lymphatic/Immunilogical: No cervical lymphadenopathy. Cardiovascular: Normal rate, regular rhythm. Normal S1 and S2.  Good peripheral circulation. Respiratory: Normal respiratory effort without tachypnea or retractions. Lungs CTAB. Good air entry to the bases with no decreased or absent breath sounds. Gastrointestinal: Bowel sounds  4 quadrants. Soft and nontender to palpation. No guarding or rigidity. No palpable masses. No distention. No CVA tenderness.  Skin:  Skin is warm, dry and intact. No rash noted. Psychiatric: Mood and affect are normal. Speech and behavior are normal. Patient exhibits appropriate insight and judgement.   ____________________________________________   LABS (all labs  ordered are listed, but only abnormal results are displayed)  Labs Reviewed - No data to display ____________________________________________  EKG   ____________________________________________  RADIOLOGY  No results found.  ____________________________________________    PROCEDURES  Procedure(s) performed:    Procedures    Medications - No data to display   ____________________________________________   INITIAL IMPRESSION / ASSESSMENT AND PLAN / ED COURSE  Pertinent labs & imaging results that were available during my care of the patient were reviewed by me and considered in my medical decision making (see chart for details).  Review of the [place] CSRS was performed in accordance of the [facility] prior to dispensing any controlled drugs.     Assessment and Plan: Influenza:  Patient presents to the emergency department with headache, congestion, rhinorrhea, malaise, diarrhea and vomiting for the past four days. Patient's symptoms are consistent with influenza. Patient was discharged with Tessalon pearls. Vital signs and physical exam are reassuring at this time. Patient was advised to follow-up with his primary care provider in one week. Rest and hydration were encouraged. All patient questions were answered. __________________________________________  FINAL CLINICAL IMPRESSION(S) / ED DIAGNOSES  Final diagnoses:  Influenza      NEW MEDICATIONS STARTED DURING THIS VISIT:  Discharge Medication List as of 07/23/2016  2:02 PM    START taking these medications   Details  benzonatate (TESSALON PERLES) 100 MG capsule Take 1 capsule (100 mg total) by mouth 3 (three) times daily as needed for cough., Starting Sun 07/23/2016, Until Fri 07/28/2016, Print            This chart was dictated using voice recognition software/Dragon. Despite best efforts to proofread, errors can occur which can change the meaning. Any change was purely unintentional.    Orvil FeilJaclyn M Nicholis Stepanek,  PA-C 07/23/16 2018    Jene Everyobert Kinner, MD 07/24/16 [phone]

## 2016-07-23 NOTE — ED Notes (Signed)
AAOx3.  Skin warm and dry.  NAD 

## 2016-07-23 NOTE — ED Triage Notes (Signed)
Pt comes into the ED via POV c/o N/V, cough, headache, and congestion.  Patient was seen here on Friday morning and diagnosed with a "stomch bug" according to the patient.  Patient is ambulatory into triage and in NAD at this time. Denies any chest pain or shortness of breath. All VS stable at this moment.

## 2016-08-11 ENCOUNTER — Emergency Department
Admission: EM | Admit: 2016-08-11 | Discharge: 2016-08-11 | Disposition: A | Discharge status: Home / Self Care | Payer: Medicaid Other | Attending: Emergency Medicine | Admitting: Emergency Medicine

## 2016-08-11 ENCOUNTER — Emergency Department: Payer: Medicaid Other

## 2016-08-11 ENCOUNTER — Encounter: Payer: Self-pay | Admitting: Emergency Medicine

## 2016-08-11 DIAGNOSIS — M659 Synovitis and tenosynovitis, unspecified: Secondary | ICD-10-CM

## 2016-08-11 DIAGNOSIS — M65842 Other synovitis and tenosynovitis, left hand: Secondary | ICD-10-CM | POA: Insufficient documentation

## 2016-08-11 MED ORDER — NAPROXEN 500 MG PO TABS: 500.0000 mg | ORAL_TABLET | Freq: Two times a day (BID) | ORAL | 0 refills | Status: DC

## 2016-08-11 NOTE — ED Triage Notes (Signed)
Patient with complaint of pain to left hand that started Wednesday morning. Patient denies any injury.

## 2016-08-11 NOTE — ED Provider Notes (Signed)
[facility] Emergency Department Provider Note   ____________________________________________   None    (approximate)  I have reviewed the triage vital signs and the nursing notes.   HISTORY  Chief Complaint Hand Pain    HPI Chris Hart is a 31 y.o. male patient complaining of left flank pain for 2 days. 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 Patient is right-hand dominant. Patient rates the pain as a 6/10. Patient described the pain as "achy". No palliative measures for this complaint.   History reviewed. No pertinent past medical history.  There are no active problems to display for this patient.   History reviewed. No pertinent surgical history.  Prior to Admission medications   Medication Sig Start Date End Date Taking? Authorizing Provider  naproxen (NAPROSYN) 500 MG tablet Take 1 tablet (500 mg total) by mouth 2 (two) times daily with a meal. 08/11/16   Joni Reiningonald K Ladanian Kelter, PA-C  ondansetron (ZOFRAN ODT) 4 MG disintegrating tablet Take 1 tablet (4 mg total) by mouth every 8 (eight) hours as needed for nausea or vomiting. 07/21/16   Tommi Rumpshonda L Summers, PA-C    Allergies Bactrim [sulfamethoxazole-trimethoprim]  No family history on file.  Social History Social History  Substance Use Topics  . Smoking status: Never Smoker  . Smokeless tobacco: Never Used  . Alcohol use Yes    Review of Systems Constitutional: No fever/chills Eyes: No visual changes. ENT: No sore throat. Cardiovascular: Denies chest pain. Respiratory: Denies shortness of breath. Gastrointestinal: No abdominal pain.  No nausea, no vomiting.  No diarrhea.  No constipation. Genitourinary: Negative for dysuria. Musculoskeletal: Hand pain  Skin: Negative for rash. Neurological: Negative for headaches, focal weakness or  numbness.    ____________________________________________   PHYSICAL EXAM:  VITAL SIGNS: ED Triage Vitals  Enc Vitals Group     BP 08/11/16 0636 127/82     Pulse Rate 08/11/16 0636 76     Resp 08/11/16 0636 18     Temp 08/11/16 0636 98.9 F (37.2 C)     Temp Source 08/11/16 0636 Oral     SpO2 08/11/16 0636 96 %     Weight 08/11/16 0637 280 lb (127 kg)     Height 08/11/16 0637 6\' 1"  (1.854 m)     Head Circumference --      Peak Flow --      Pain Score 08/11/16 0637 6     Pain Loc --      Pain Edu? --      Excl. in GC? --     Constitutional: Alert and oriented. Well appearing and in no acute distress. Eyes: Conjunctivae are normal. PERRL. EOMI. Head: Atraumatic. Nose: No congestion/rhinnorhea. Mouth/Throat: Mucous membranes are moist.  Oropharynx non-erythematous. Neck: No stridor. No cervical spine tenderness to palpation. Hematological/Lymphatic/Immunilogical: No cervical lymphadenopathy. Cardiovascular: Normal rate, regular rhythm. Grossly normal heart sounds.  Good peripheral circulation. Respiratory: Normal respiratory effort.  No retractions. Lungs CTAB. Gastrointestinal: Soft and nontender. No distention. No abdominal bruits. No CVA tenderness. Musculoskeletal:No obvious deformity to the left hand. Patient has mild edema to the third and fourth metacarpal head. Neurologic:  Normal speech and language. No gross focal neurologic deficits are appreciated. No gait instability. Skin:  Skin is warm, dry and intact. No rash noted. Psychiatric: Mood and affect are normal. Speech and behavior are normal.  ____________________________________________   LABS (all labs ordered are listed, but only  abnormal results are displayed)  Labs Reviewed - No data to display ____________________________________________  EKG   ____________________________________________  RADIOLOGY  No acute findings on  x-ray ____________________________________________   PROCEDURES  Procedure(s) performed: None  Procedures  Critical Care performed: No  ____________________________________________   INITIAL IMPRESSION / ASSESSMENT AND PLAN / ED COURSE  Pertinent labs & imaging results that were available during my care of the patient were reviewed by me and considered in my medical decision making (see chart for details).  Tenosynovitis left hand. Patient given discharge Instructions. Patient given a prescription for naproxen. Patient advised to follow orthopedics if no improvement in 7-10 days. Patient given a work note.      ____________________________________________   FINAL CLINICAL IMPRESSION(S) / ED DIAGNOSES  Final diagnoses:  Tenosynovitis of finger and hand      NEW MEDICATIONS STARTED DURING THIS VISIT:  New Prescriptions   NAPROXEN (NAPROSYN) 500 MG TABLET    Take 1 tablet (500 mg total) by mouth 2 (two) times daily with a meal.     Note:  This document was prepared using Dragon voice recognition software and may include unintentional dictation errors.    Joni Reining, PA-C 08/11/16 0747    Charlynne Pander, MD 08/11/16 [phone]

## 2016-12-11 ENCOUNTER — Emergency Department
Admission: EM | Admit: 2016-12-11 | Discharge: 2016-12-11 | Disposition: A | Discharge status: Home / Self Care | Payer: Medicaid Other | Attending: Emergency Medicine | Admitting: Emergency Medicine

## 2016-12-11 DIAGNOSIS — J029 Acute pharyngitis, unspecified: Secondary | ICD-10-CM | POA: Insufficient documentation

## 2016-12-11 DIAGNOSIS — R197 Diarrhea, unspecified: Secondary | ICD-10-CM | POA: Insufficient documentation

## 2016-12-11 DIAGNOSIS — R509 Fever, unspecified: Secondary | ICD-10-CM | POA: Insufficient documentation

## 2016-12-11 LAB — COMPREHENSIVE METABOLIC PANEL
ALBUMIN: 4.1 g/dL (ref 3.5–5.0)
ALK PHOS: 61 U/L (ref 38–126)
ALT: 22 U/L (ref 17–63)
AST: 20 U/L (ref 15–41)
Anion gap: 6 (ref 5–15)
BUN: 15 mg/dL (ref 6–20)
CALCIUM: 9 mg/dL (ref 8.9–10.3)
CO2: 27 mmol/L (ref 22–32)
CREATININE: 0.9 mg/dL (ref 0.61–1.24)
Chloride: 103 mmol/L (ref 101–111)
GFR calc Af Amer: >60 mL/min (ref >60)
GFR calc non Af Amer: >60 mL/min (ref >60)
Glucose, Bld: 107 mg/dL — ABNORMAL HIGH (ref 65–99)
Potassium: 4.2 mmol/L (ref 3.5–5.1)
SODIUM: 136 mmol/L (ref 135–145)
Total Bilirubin: 1 mg/dL (ref 0.3–1.2)
Total Protein: 7.6 g/dL (ref 6.5–8.1)

## 2016-12-11 LAB — CBC
HCT: 39.5 % — ABNORMAL LOW (ref 40.0–52.0)
HEMOGLOBIN: 14 g/dL (ref 13.0–18.0)
MCH: 31 pg (ref 26.0–34.0)
MCHC: 35.3 g/dL (ref 32.0–36.0)
MCV: 87.6 fL (ref 80.0–100.0)
Platelets: 242 10*3/uL (ref 150–440)
RBC: 4.51 MIL/uL (ref 4.40–5.90)
RDW: 13.3 % (ref 11.5–14.5)
WBC: 8.4 10*3/uL (ref 3.8–10.6)

## 2016-12-11 LAB — POCT RAPID STREP A: Streptococcus, Group A Screen (Direct): NEGATIVE

## 2016-12-11 LAB — LIPASE, BLOOD: Lipase: 20 U/L (ref 11–51)

## 2016-12-11 MED ORDER — SODIUM CHLORIDE 0.9 % IV BOLUS (SEPSIS)
1000.0000 mL | Freq: Once | INTRAVENOUS | Status: AC
  Administered 2016-12-11: 1000 mL via INTRAVENOUS

## 2016-12-11 MED ORDER — LOPERAMIDE HCL 2 MG PO TABS: 2.0000 mg | ORAL_TABLET | Freq: Four times a day (QID) | ORAL | 0 refills | Status: DC | PRN

## 2016-12-11 NOTE — ED Triage Notes (Signed)
Reports woke Saturday with scratchy sore throat.  On Sunday woke with fever and diarrhea.  Reports woke again with fever and diarrhea this morning.

## 2016-12-11 NOTE — ED Notes (Signed)
Patient stated he has had multiple fevers at home with temps 101.5 on Sunday and 101.2 this morning.  Patient has been taking Tylenol to control fever, and temp in triage is 98.7

## 2016-12-11 NOTE — ED Provider Notes (Signed)
[facility] Emergency Department Provider Note  Time seen: 7:55 AM  I have reviewed the triage vital signs and the nursing notes.   HISTORY  Chief Complaint Fever and Diarrhea    HPI Chris Hart is a 31 y.o. male with no past medical history who presents to the emergency department with a fever and diarrhea. According to the patient Saturday he awoke with a sore throat, states he continues to have a mild sore throat but states the sore throat is improving. Yesterday and today he has had a fever he states to 102 at home along with loose stool. Patient states multiple episodes of diarrhea per day which is very watery. Denies any blood or black. Denies any nausea or vomiting. Denies any pain when he urinates. Denies any cough or congestion. Patient states his daughter was recently ill with a sore throat and his daughter's friend had similar symptoms to himself with fever and diarrhea.  No past medical history on file.  There are no active problems to display for this patient.   No past surgical history on file.  Prior to Admission medications   Medication Sig Start Date End Date Taking? Authorizing Provider  naproxen (NAPROSYN) 500 MG tablet Take 1 tablet (500 mg total) by mouth 2 (two) times daily with a meal. Patient not taking: Reported on 12/11/2016 08/11/16   Joni ReiningSmith, Ronald K, PA-C  ondansetron (ZOFRAN ODT) 4 MG disintegrating tablet Take 1 tablet (4 mg total) by mouth every 8 (eight) hours as needed for nausea or vomiting. Patient not taking: Reported on 12/11/2016 07/21/16   Tommi RumpsSummers, Rhonda L, PA-C    Allergies  Allergen Reactions  . Bactrim [Sulfamethoxazole-Trimethoprim] Itching and Rash    No family history on file.  Social History Social History  Substance Use Topics  . Smoking status: Never Smoker  . Smokeless tobacco: Never Used  . Alcohol use Yes    Review of Systems Constitutional: Negative for fever. Cardiovascular: Negative for  chest pain. Respiratory: Negative for shortness of breath. Gastrointestinal: Negative for abdominal pain Genitourinary: Negative for dysuria. Musculoskeletal: Negative for back pain. Skin: Negative for rash. Neurological: Negative for headache All other ROS negative  ____________________________________________   PHYSICAL EXAM:  VITAL SIGNS: ED Triage Vitals  Enc Vitals Group     BP 12/11/16 0616 (!) 143/87     Pulse Rate 12/11/16 0616 94     Resp 12/11/16 0616 18     Temp 12/11/16 0616 98.7 F (37.1 C)     Temp Source 12/11/16 0616 Oral     SpO2 12/11/16 0616 95 %     Weight 12/11/16 0615 290 lb (131.5 kg)     Height 12/11/16 0615 6\' 1"  (1.854 m)     Head Circumference --      Peak Flow --      Pain Score 12/11/16 0615 4     Pain Loc --      Pain Edu? --      Excl. in GC? --    Constitutional: Alert and oriented. Well appearing and in no distress. Eyes: Normal exam ENT   Head: Normocephalic and atraumatic.   Mouth/Throat: Mucous membranes are moist.Minimal pharyngeal erythema, no exudate. Cardiovascular: Normal rate, regular rhythm. No murmur Respiratory: Normal respiratory effort without tachypnea nor retractions. Breath sounds are clear  Gastrointestinal: Soft and nontender. No distention.  No CVA tenderness Musculoskeletal: Nontender with normal range of motion in all extremities.  Neurologic:  Normal speech and language. No gross  focal neurologic deficits Skin:  Skin is warm, dry and intact.  Psychiatric: Mood and affect are normal.   ____________________________________________   INITIAL IMPRESSION / ASSESSMENT AND PLAN / ED COURSE  Pertinent labs & imaging results that were available during my care of the patient were reviewed by me and considered in my medical decision making (see chart for details).  The patient presents to the emergency department with fever and diarrhea, as well as a mild sore throat. Overall patient appears very well, completely  nontender abdominal exam, no CVA tenderness. Very normal physical exam with overall normal vital signs, afebrile in the emergency department. Family member with similar symptoms as well, highly suspect likely viral diarrheal illness, we will check labs, IV hydrate, rapid strep and closely monitor in the emergency department. The patient is agreeable to plan.  Labs are largely within normal limits. We will discharge home with supportive care. Patient agreeable.  ____________________________________________   FINAL CLINICAL IMPRESSION(S) / ED DIAGNOSES  Diarrhea Fever    Minna Antis, MD 12/11/16 [phone]

## 2016-12-13 LAB — CULTURE, GROUP A STREP (THRC)

## 2016-12-25 ENCOUNTER — Emergency Department
Admission: EM | Admit: 2016-12-25 | Discharge: 2016-12-25 | Disposition: A | Discharge status: Home / Self Care | Payer: Medicaid Other | Attending: Emergency Medicine | Admitting: Emergency Medicine

## 2016-12-25 DIAGNOSIS — R197 Diarrhea, unspecified: Secondary | ICD-10-CM

## 2016-12-25 DIAGNOSIS — K529 Noninfective gastroenteritis and colitis, unspecified: Secondary | ICD-10-CM | POA: Insufficient documentation

## 2016-12-25 NOTE — ED Triage Notes (Signed)
Patient reports woke this morning with abdominal pain and diarrhea.

## 2016-12-25 NOTE — ED Provider Notes (Signed)
[facility] Emergency Department Provider Note   ____________________________________________    I have reviewed the triage vital signs and the nursing notes.   HISTORY  Chief Complaint Abdominal Pain     HPI Chris Hart is a 31 y.o. male who presents with complaints of diarrhea. Patient notes he woke up this morning at 2 AM and had to run to the bathroom and had nonbloody diarrhea. He notes he has had about 5 episodes of diarrhea since 2 AM. He reports he has significant abdominal cramping just prior to having diarrhea but feels better afterwards. No fevers or chills. No recent travel. No recent camping. No sick contacts reported. No exposure to unusual food. He took one Imodium without significant relief   No past medical history on file.  There are no active problems to display for this patient.   No past surgical history on file.  Prior to Admission medications   Medication Sig Start Date End Date Taking? Authorizing Provider  loperamide (IMODIUM A-D) 2 MG tablet Take 1 tablet (2 mg total) by mouth 4 (four) times daily as needed for diarrhea or loose stools. 12/11/16   Minna AntisPaduchowski, Kevin, MD  naproxen (NAPROSYN) 500 MG tablet Take 1 tablet (500 mg total) by mouth 2 (two) times daily with a meal. Patient not taking: Reported on 12/11/2016 08/11/16   Joni ReiningSmith, Ronald K, PA-C  ondansetron (ZOFRAN ODT) 4 MG disintegrating tablet Take 1 tablet (4 mg total) by mouth every 8 (eight) hours as needed for nausea or vomiting. Patient not taking: Reported on 12/11/2016 07/21/16   Tommi RumpsSummers, Rhonda L, PA-C     Allergies Bactrim [sulfamethoxazole-trimethoprim]  No family history on file.  Social History Social History  Substance Use Topics  . Smoking status: Never Smoker  . Smokeless tobacco: Never Used  . Alcohol use Yes    Review of Systems  Constitutional: No fever/chills Eyes: No visual changes.  ENT: No sore throat. Cardiovascular: Denies  Palpitations Respiratory: Denies shortness of breath. Gastrointestinal: No nausea or vomiting Genitourinary: Negative for dysuria. Musculoskeletal: Negative for myalgias Skin: Negative for rash. Neurological: Negative for headaches    ____________________________________________   PHYSICAL EXAM:  VITAL SIGNS: ED Triage Vitals  Enc Vitals Group     BP 12/25/16 0632 119/76     Pulse Rate 12/25/16 0632 71     Resp 12/25/16 0632 18     Temp 12/25/16 0632 98.2 F (36.8 C)     Temp Source 12/25/16 0632 Oral     SpO2 12/25/16 0632 97 %     Weight --      Height --      Head Circumference --      Peak Flow --      Pain Score 12/25/16 0631 4     Pain Loc --      Pain Edu? --      Excl. in GC? --     Constitutional: Alert and oriented. No acute distress. Pleasant and interactive Eyes: Conjunctivae are normal.   Nose: No congestion/rhinnorhea. Mouth/Throat: Mucous membranes are moist.   Neck:  Painless ROM Cardiovascular: Normal rate, regular rhythm. Grossly normal heart sounds.  Good peripheral circulation. Respiratory: Normal respiratory effort.  No retractions.  Gastrointestinal: Soft and nontender. No distention.  No CVA tenderness.Reassuring exam Genitourinary: deferred Musculoskeletal:Warm and well perfused Neurologic:  Normal speech and language. No gross focal neurologic deficits are appreciated.  Skin:  Skin is warm, dry and intact. No rash noted. Psychiatric: Mood  and affect are normal. Speech and behavior are normal.  ____________________________________________   LABS (all labs ordered are listed, but only abnormal results are displayed)  Labs Reviewed - No data to display ____________________________________________  EKG  None ____________________________________________  RADIOLOGY  None ____________________________________________   PROCEDURES  Procedure(s) performed: No    Critical Care performed:  No ____________________________________________   INITIAL IMPRESSION / ASSESSMENT AND PLAN / ED COURSE  Pertinent labs & imaging results that were available during my care of the patient were reviewed by me and considered in my medical decision making (see chart for details).  Patient presents with complaints primarily of diarrhea which started 5 hours ago. He is overall well-appearing and in no acute distress. Vital signs are completely normal. Reassuring abdominal exam with no tenderness to palpation. Do not feel lab work or additional testing is necessary at this time. Recommend symptomatic care with Imodium and to focus on staying hydrated. Return precautions discussed    ____________________________________________   FINAL CLINICAL IMPRESSION(S) / ED DIAGNOSES  Final diagnoses:  Diarrhea of presumed infectious origin      NEW MEDICATIONS STARTED DURING THIS VISIT:  New Prescriptions   No medications on file     Note:  This document was prepared using Dragon voice recognition software and may include unintentional dictation errors.    Jene Every, MD 12/25/16 [phone]

## 2016-12-25 NOTE — ED Notes (Signed)
ED Provider at bedside. 

## 2017-01-15 ENCOUNTER — Encounter: Payer: Self-pay | Admitting: Emergency Medicine

## 2017-01-15 ENCOUNTER — Emergency Department
Admission: EM | Admit: 2017-01-15 | Discharge: 2017-01-15 | Disposition: A | Discharge status: Home / Self Care | Payer: Medicaid Other | Attending: Emergency Medicine | Admitting: Emergency Medicine

## 2017-01-15 DIAGNOSIS — M546 Pain in thoracic spine: Secondary | ICD-10-CM | POA: Insufficient documentation

## 2017-01-15 MED ORDER — CYCLOBENZAPRINE HCL 10 MG PO TABS: 10.0000 mg | ORAL_TABLET | Freq: Three times a day (TID) | ORAL | 0 refills | Status: DC | PRN

## 2017-01-15 MED ORDER — CYCLOBENZAPRINE HCL 10 MG PO TABS
10.0000 mg | ORAL_TABLET | Freq: Once | ORAL | Status: AC
Start: 2017-01-15 — End: 2017-01-15
  Administered 2017-01-15: 10 mg via ORAL
  Filled 2017-01-15: qty 1

## 2017-01-15 NOTE — ED Provider Notes (Signed)
[facility] Emergency Department Provider Note    First MD Initiated Contact with Patient 01/15/17 [phone]     (approximate)  I have reviewed the triage vital signs and the nursing notes.   HISTORY  Chief Complaint Back Pain   HPI Chris Hart is a 31 y.o. male presents with nontraumatic left upper back pain worse with movement 2 days. 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 Patient states that this has occurred before in the past however usually resolved however this episode has been rather persistent. Patient states his current pain score is 6 out of 10.   History reviewed. No pertinent past medical history.  There are no active problems to display for this patient.   History reviewed. No pertinent surgical history.  Prior to Admission medications   Medication Sig Start Date End Date Taking? Authorizing Provider  cyclobenzaprine (FLEXERIL) 10 MG tablet Take 1 tablet (10 mg total) by mouth 3 (three) times daily as needed. 01/15/17   Darci Current, MD    Allergies Bactrim [sulfamethoxazole-trimethoprim]  History reviewed. No pertinent family history.  Social History Social History  Substance Use Topics  . Smoking status: Never Smoker  . Smokeless tobacco: Never Used  . Alcohol use Yes    Review of Systems Constitutional: No fever/chills Eyes: No visual changes. ENT: No sore throat. Cardiovascular: Denies chest pain. Respiratory: Denies shortness of breath. Gastrointestinal: No abdominal pain.  No nausea, no vomiting.  No diarrhea.  No constipation. Genitourinary: Negative for dysuria. Musculoskeletal: Negative for neck pain.  Positive for left upper back pain Integumentary: Negative for rash. Neurological: Negative for headaches, focal weakness or numbness.   ____________________________________________   PHYSICAL EXAM:  VITAL SIGNS: ED Triage  Vitals  Enc Vitals Group     BP 01/15/17 0516 124/80     Pulse Rate 01/15/17 0516 82     Resp 01/15/17 0516 18     Temp 01/15/17 0516 98.1 F (36.7 C)     Temp Source 01/15/17 0516 Oral     SpO2 01/15/17 0516 97 %     Weight 01/15/17 0512 131.5 kg (290 lb)     Height 01/15/17 0512 1.854 m (6\' 1" )     Head Circumference --      Peak Flow --      Pain Score 01/15/17 0511 6     Pain Loc --      Pain Edu? --      Excl. in GC? --     Constitutional: Alert and oriented. Well appearing and in no acute distress. Eyes: Conjunctivae are normal.  Head: Atraumatic. Mouth/Throat: Mucous membranes are moist.  Oropharynx non-erythematous. Neck: No stridor.   Cardiovascular: Normal rate, regular rhythm. Good peripheral circulation. Grossly normal heart sounds. Respiratory: Normal respiratory effort.  No retractions. Lungs CTAB. Gastrointestinal: Soft and nontender. No distention.  Musculoskeletal: No lower extremity tenderness nor edema. No gross deformities of extremities.Positive palpation of the left thoracic paraspinal muscles and rhomboid muscles. Neurologic:  Normal speech and language. No gross focal neurologic deficits are appreciated.  Skin:  Skin is warm, dry and intact. No rash noted. Psychiatric: Mood and affect are normal. Speech and behavior are normal.    Procedures   ____________________________________________   INITIAL IMPRESSION / ASSESSMENT AND PLAN / ED COURSE  Pertinent labs & imaging results that were available during my care of the patient were reviewed  by me and considered in my medical decision making (see chart for details).  31 year old male presenting with history of physical exam consistent with musculoskeletal back pain. [facility]. Patient advised not to drive while taking Flexeril.      ____________________________________________  FINAL CLINICAL IMPRESSION(S) / ED DIAGNOSES  Final  diagnoses:  Acute left-sided thoracic back pain     MEDICATIONS GIVEN DURING THIS VISIT:  Medications - No data to display   NEW OUTPATIENT MEDICATIONS STARTED DURING THIS VISIT:  New Prescriptions   CYCLOBENZAPRINE (FLEXERIL) 10 MG TABLET    Take 1 tablet (10 mg total) by mouth 3 (three) times daily as needed.    Modified Medications   No medications on file    Discontinued Medications   LOPERAMIDE (IMODIUM A-D) 2 MG TABLET    Take 1 tablet (2 mg total) by mouth 4 (four) times daily as needed for diarrhea or loose stools.   NAPROXEN (NAPROSYN) 500 MG TABLET    Take 1 tablet (500 mg total) by mouth 2 (two) times daily with a meal.   ONDANSETRON (ZOFRAN ODT) 4 MG DISINTEGRATING TABLET    Take 1 tablet (4 mg total) by mouth every 8 (eight) hours as needed for nausea or vomiting.     Note:  This document was prepared using Dragon voice recognition software and may include unintentional dictation errors.    Darci CurrentBrown, [place], MD 01/15/17 [phone]

## 2017-01-15 NOTE — ED Triage Notes (Signed)
Pt reports left upper back pain since Saturday; worse with movement; denies injury; denies cough; when pain is at it's worst pt says he feels short of breath

## 2017-06-21 ENCOUNTER — Encounter: Payer: Self-pay | Admitting: Emergency Medicine

## 2017-06-21 ENCOUNTER — Other Ambulatory Visit: Payer: Self-pay

## 2017-06-21 ENCOUNTER — Emergency Department
Admission: EM | Admit: 2017-06-21 | Discharge: 2017-06-21 | Disposition: A | Discharge status: Home / Self Care | Payer: Self-pay | Attending: Emergency Medicine | Admitting: Emergency Medicine

## 2017-06-21 DIAGNOSIS — R0981 Nasal congestion: Secondary | ICD-10-CM | POA: Insufficient documentation

## 2017-06-21 DIAGNOSIS — Z79899 Other long term (current) drug therapy: Secondary | ICD-10-CM | POA: Insufficient documentation

## 2017-06-21 DIAGNOSIS — J029 Acute pharyngitis, unspecified: Secondary | ICD-10-CM | POA: Insufficient documentation

## 2017-06-21 LAB — GROUP A STREP BY PCR: GROUP A STREP BY PCR: NOT DETECTED

## 2017-06-21 MED ORDER — CLINDAMYCIN HCL 150 MG PO CAPS: 150.0000 mg | ORAL_CAPSULE | Freq: Four times a day (QID) | ORAL | 0 refills | Status: DC

## 2017-06-21 MED ORDER — DIPHENHYDRAMINE HCL 12.5 MG/5ML PO ELIX
25.0000 mg | ORAL_SOLUTION | Freq: Once | ORAL | Status: AC
Start: 2017-06-21 — End: 2017-06-21
  Administered 2017-06-21: 25 mg via ORAL
  Filled 2017-06-21: qty 10

## 2017-06-21 MED ORDER — MAGIC MOUTHWASH W/LIDOCAINE: 5.0000 mL | Freq: Four times a day (QID) | ORAL | 0 refills | Status: DC

## 2017-06-21 MED ORDER — LIDOCAINE VISCOUS 2 % MT SOLN
15.0000 mL | Freq: Once | OROMUCOSAL | Status: AC
  Administered 2017-06-21: 15 mL via OROMUCOSAL
  Filled 2017-06-21: qty 15

## 2017-06-21 NOTE — ED Triage Notes (Signed)
States he woke up with sore throat this am  Increased pain with swallowing no fever

## 2017-06-21 NOTE — ED Provider Notes (Signed)
[facility] Emergency Department Provider Note   ____________________________________________   First MD Initiated Contact with Patient 06/21/17 [phone]     (approximate)  I have reviewed the triage vital signs and the nursing notes.   HISTORY  Chief Complaint Sore Throat    HPI Lorriane ShireJulian Alan Self is a [age] y.o. male patient complaining of sore throat and pain with swallowing for a.m. awakening. Patient stated noticed mild sore throat before sleeping last night. Patient denies URI signs symptoms except for nasal congestion. Patient denies fever. Patient denies nausea, vomiting, diarrhea. No palliative measures for complaint. Patient rates his pain as a 5/10. Patient is gravida pain as "burning".  History reviewed. No pertinent past medical history.  There are no active problems to display for this patient.   History reviewed. No pertinent surgical history.  Prior to Admission medications   Medication Sig Start Date End Date Taking? Authorizing Provider  clindamycin (CLEOCIN) 150 MG capsule Take 1 capsule (150 mg total) by mouth 4 (four) times daily. 06/21/17   Joni ReiningSmith, Alekzander Cardell K, PA-C  cyclobenzaprine (FLEXERIL) 10 MG tablet Take 1 tablet (10 mg total) by mouth 3 (three) times daily as needed. 01/15/17   Darci CurrentBrown, [place] N, MD  magic mouthwash w/lidocaine SOLN Take 5 mLs by mouth 4 (four) times daily. Swish and swallow 06/21/17   Joni ReiningSmith, Hashem Goynes K, PA-C    Allergies Bactrim [sulfamethoxazole-trimethoprim]  No family history on file.  Social History Social History   Tobacco Use  . Smoking status: Never Smoker  . Smokeless tobacco: Never Used  Substance Use Topics  . Alcohol use: Yes  . Drug use: No    Review of Systems  Constitutional: No fever/chills Eyes: No visual changes. ENT: Sore throat and nasal congestion Cardiovascular: Denies chest pain. Respiratory: Denies shortness of breath. Gastrointestinal: No abdominal pain.  No nausea, no  vomiting.  No diarrhea.  No constipation. Genitourinary: Negative for dysuria. Musculoskeletal: Negative for back pain. Skin: Negative for rash. Neurological: Negative for headaches, focal weakness or numbness. Allergic/Immunilogical: Bactrim  ____________________________________________   PHYSICAL EXAM:  VITAL SIGNS: ED Triage Vitals  Enc Vitals Group     BP 06/21/17 0800 113/79     Pulse Rate 06/21/17 0800 90     Resp 06/21/17 0800 18     Temp 06/21/17 0800 98.3 F (36.8 C)     Temp Source 06/21/17 0800 Oral     SpO2 06/21/17 0800 96 %     Weight 06/21/17 0806 280 lb (127 kg)     Height 06/21/17 0806 6\' 1"  (1.854 m)     Head Circumference --      Peak Flow --      Pain Score --      Pain Loc --      Pain Edu? --      Excl. in GC? --    Constitutional: Alert and oriented. Well appearing and in no acute distress. Nose: No congestion/rhinnorhea. Mouth/Throat: Mucous membranes are moist.  Oropharynx erythematous. Neck: No stridor.  Hematological/Lymphatic/Immunilogical: No cervical lymphadenopathy. Cardiovascular: Normal rate, regular rhythm. Grossly normal heart sounds.  Good peripheral circulation. Respiratory: Normal respiratory effort.  No retractions. Lungs CTAB. Neurologic:  Normal speech and language. No gross focal neurologic deficits are appreciated. No gait instability. Skin:  Skin is warm, dry and intact. No rash noted. Psychiatric: Mood and affect are normal. Speech and behavior are normal.  ____________________________________________   LABS (all labs ordered are listed, but only abnormal results are displayed)  Labs Reviewed  GROUP A STREP BY PCR   ____________________________________________  EKG   ____________________________________________  RADIOLOGY  No results found.  ____________________________________________   PROCEDURES  Procedure(s) performed: None  Procedures  Critical Care performed:  No  ____________________________________________   INITIAL IMPRESSION / ASSESSMENT AND PLAN / ED COURSE  As part of my medical decision making, I reviewed the following data within the electronic MEDICAL RECORD NUMBER    Sore throat secondary to viral pharyngitis. Discussed negative strep results with patient. Patient given discharge care instruction prostate medication as directed. Patient given a work note for today.      ____________________________________________   FINAL CLINICAL IMPRESSION(S) / ED DIAGNOSES  Final diagnoses:  Viral pharyngitis        Note:  This document was prepared using Dragon voice recognition software and may include unintentional dictation errors.    Joni ReiningSmith, Altovise Wahler K, PA-C 06/21/17 [phone]    Jene EveryKinner, Robert, MD 06/21/17 [phone]

## 2017-06-22 ENCOUNTER — Emergency Department
Admission: EM | Admit: 2017-06-22 | Discharge: 2017-06-22 | Disposition: A | Discharge status: Home / Self Care | Payer: Self-pay | Attending: Emergency Medicine | Admitting: Emergency Medicine

## 2017-06-22 ENCOUNTER — Encounter: Payer: Self-pay | Admitting: Emergency Medicine

## 2017-06-22 ENCOUNTER — Other Ambulatory Visit: Payer: Self-pay

## 2017-06-22 DIAGNOSIS — J069 Acute upper respiratory infection, unspecified: Secondary | ICD-10-CM | POA: Insufficient documentation

## 2017-06-22 DIAGNOSIS — Z79899 Other long term (current) drug therapy: Secondary | ICD-10-CM | POA: Insufficient documentation

## 2017-06-22 MED ORDER — BENZONATATE 100 MG PO CAPS: 100.0000 mg | ORAL_CAPSULE | Freq: Four times a day (QID) | ORAL | 0 refills | Status: AC | PRN

## 2017-06-22 NOTE — ED Triage Notes (Signed)
Patient ambulatory to triage with steady gait, without difficulty or distress noted; pt reports seen yesterday for sore throat and rx "magical mouthwash"; c/o persistent sore throat, cough and HA

## 2017-06-22 NOTE — ED Provider Notes (Addendum)
[facility] Emergency Department Provider Note  ____________________________________________   I have reviewed the triage vital signs and the nursing notes. Where available I have reviewed prior notes and, if possible and indicated, outside hospital notes.    HISTORY  Chief Complaint Sore Throat and Cough    HPI Chris Hart is a 31 y.o. male healthy, has had multiple different visits to the emergency department in the last year mostly for viral illnesses including several visits for diarrhea and URI symptoms.  Also noted is one visit to his primary care for similar.  He presents today because he has a runny nose, sore throat, and now has developed a cough.  The cough is new admit him worried.  He also feels that he needs a note for his job.  He denies any fever nausea or vomiting.  He denies any chest pain or shortness of breath.  He denies any severe or persistent headaches or sudden onset headaches although when he does cough he states it makes his head hurt a little bit.  Positive sick contacts with similar very high community burden of same.  Here yesterday for the same thing but now that the cough is here and he needs a work note he is back.   Location: The above Radiation: Above Quality: See above Duration: 3 days Timing: As above Severity: Mild Associated sxs: As above PriorTreatment : Mouthwash   History reviewed. No pertinent past medical history.  There are no active problems to display for this patient.   History reviewed. No pertinent surgical history.  Prior to Admission medications   Medication Sig Start Date End Date Taking? Authorizing Provider  clindamycin (CLEOCIN) 150 MG capsule Take 1 capsule (150 mg total) by mouth 4 (four) times daily. 06/21/17   Joni ReiningSmith, Ronald K, PA-C  cyclobenzaprine (FLEXERIL) 10 MG tablet Take 1 tablet (10 mg total) by mouth 3 (three) times daily as needed. 01/15/17   Darci CurrentBrown, [place] N, MD  magic mouthwash  w/lidocaine SOLN Take 5 mLs by mouth 4 (four) times daily. Swish and swallow 06/21/17   Joni ReiningSmith, Ronald K, PA-C    Allergies Bactrim [sulfamethoxazole-trimethoprim]  No family history on file.  Social History Social History   Tobacco Use  . Smoking status: Never Smoker  . Smokeless tobacco: Never Used  Substance Use Topics  . Alcohol use: Yes  . Drug use: No    Review of Systems Constitutional: No fever/chills Eyes: No visual changes. ENT: Positive sore throat. No stiff neck no neck pain positive rhinorrhea Cardiovascular: Denies chest pain. Respiratory: Denies shortness of breath.  Positive cough Gastrointestinal:   no vomiting.  No diarrhea.  No constipation. Genitourinary: Negative for dysuria. Musculoskeletal: Negative lower extremity swelling Skin: Negative for rash. Neurological: Negative for severe headaches, focal weakness or numbness.   ____________________________________________   PHYSICAL EXAM:  VITAL SIGNS: ED Triage Vitals  Enc Vitals Group     BP 06/22/17 0643 129/75     Pulse Rate 06/22/17 0643 (!) 104     Resp 06/22/17 0643 18     Temp 06/22/17 0643 98.3 F (36.8 C)     Temp Source 06/22/17 0643 Oral     SpO2 06/22/17 0643 96 %     Weight 06/22/17 0640 290 lb (131.5 kg)     Height 06/22/17 0640 6\' 1"  (1.854 m)     Head Circumference --      Peak Flow --      Pain Score 06/22/17 0639 7  Pain Loc --      Pain Edu? --      Excl. in GC? --     Constitutional: Alert and oriented. Well appearing and in no acute distress.  Patient using his cell phone in no acute distress whatsoever appears healthy in general Eyes: Conjunctivae are normal Head: Atraumatic HEENT: Positive mild congestion/rhinnorhea. Mucous membranes are moist.  Oropharynx non-erythematous positive mild cobblestoning no evidence of RPA or TPA, no trismus, normal voice no stridor Neck:   Nontender with no meningismus, no masses, no stridor Cardiovascular: Normal rate, regular  rhythm. Grossly normal heart sounds.  Good peripheral circulation.  Heart rate 88 Respiratory: Normal respiratory effort.  No retractions. Lungs CTAB. Abdominal: Soft and nontender. No distention. No guarding no rebound Back:  There is no focal tenderness or step off.  there is no midline tenderness there are no lesions noted. there is no CVA tenderness Musculoskeletal: No lower extremity tenderness, no upper extremity tenderness. No joint effusions, no DVT signs strong distal pulses no edema Neurologic:  Normal speech and language. No gross focal neurologic deficits are appreciated.  Skin:  Skin is warm, dry and intact. No rash noted. Psychiatric: Mood and affect are normal. Speech and behavior are normal.  ____________________________________________   LABS (all labs ordered are listed, but only abnormal results are displayed)  Labs Reviewed - No data to display  Pertinent labs  results that were available during my care of the patient were reviewed by me and considered in my medical decision making (see chart for details). ____________________________________________  EKG  I personally interpreted any EKGs ordered by me or triage  ____________________________________________  RADIOLOGY  Pertinent labs & imaging results that were available during my care of the patient were reviewed by me and considered in my medical decision making (see chart for details). If possible, patient and/or family made aware of any abnormal findings.  No results found. ____________________________________________    PROCEDURES  Procedure(s) performed: None  Procedures  Critical Care performed: None  ____________________________________________   INITIAL IMPRESSION / ASSESSMENT AND PLAN / ED COURSE  Pertinent labs & imaging results that were available during my care of the patient were reviewed by me and considered in my medical decision making (see chart for details).  Patient here with  URI symptoms clearly viral to my exam.  No evidence of bacterial infection nothing to suggest RPA TPA myocarditis meningitis appendicitis pneumonia atypical pneumonia head bleed or anything requiring antibiotics at this time.  We will discharge him with supportive care, and return precautions and follow-up given and understood.  Did advise him to wash his hands with family members, and we will give him the desired work note    ____________________________________________   FINAL CLINICAL IMPRESSION(S) / ED DIAGNOSES  Final diagnoses:  None      This chart was dictated using voice recognition software.  Despite best efforts to proofread,  errors can occur which can change meaning.      Jeanmarie PlantMcShane, James A, MD 06/22/17 [phone]    Jeanmarie PlantMcShane, James A, MD 06/22/17 [phone]

## 2017-06-28 ENCOUNTER — Other Ambulatory Visit: Payer: Self-pay

## 2017-06-28 ENCOUNTER — Emergency Department
Admission: EM | Admit: 2017-06-28 | Discharge: 2017-06-28 | Disposition: A | Discharge status: Home / Self Care | Payer: Self-pay | Attending: Emergency Medicine | Admitting: Emergency Medicine

## 2017-06-28 ENCOUNTER — Encounter: Payer: Self-pay | Admitting: Emergency Medicine

## 2017-06-28 DIAGNOSIS — J069 Acute upper respiratory infection, unspecified: Secondary | ICD-10-CM | POA: Insufficient documentation

## 2017-06-28 DIAGNOSIS — J0100 Acute maxillary sinusitis, unspecified: Secondary | ICD-10-CM | POA: Insufficient documentation

## 2017-06-28 MED ORDER — GUAIFENESIN-CODEINE 100-10 MG/5ML PO SOLN: 10.0000 mL | Freq: Three times a day (TID) | ORAL | 0 refills | Status: DC | PRN

## 2017-06-28 MED ORDER — AZITHROMYCIN 250 MG PO TABS: ORAL_TABLET | ORAL | 0 refills | Status: DC

## 2017-06-28 NOTE — ED Notes (Signed)
Pt ambulatory upon discharge and in NAD. Verbalized understanding of discharge instructions, follow-up care and prescriptions. A&O x4. Skin warm and dry. VSS.

## 2017-06-28 NOTE — ED Notes (Signed)
See triage note   States he woke up with left eye draining and irritated  Also has had nasal congestion and cough for a few days

## 2017-06-28 NOTE — ED Triage Notes (Signed)
Pt to ed with c/o cough x 1 week, congestion and nasal drainage that started this am, and left eye redness and drainage.

## 2017-06-28 NOTE — ED Provider Notes (Signed)
[facility] Emergency Department Provider Note  ____________________________________________  Time seen: Approximately 9:10 AM  I have reviewed the triage vital signs and the nursing notes.   HISTORY  Chief Complaint Nasal Congestion; Cough; and Eye Drainage   HPI Chris Hart is a 32 y.o. male who presents to the emergency department for his third visit in the last week for evaluation of URI symptoms.  He was last treated with Magic mouthwash, which she states did not provide any relief.  Prior to that he was treated with Jerilynn Somessalon Perles and states that that did not help either.  He has not taken any other medications.  He denies fever, nausea, or vomiting.  He states that his cough is productive of thick green sputum.  History reviewed. No pertinent past medical history.  There are no active problems to display for this patient.   History reviewed. No pertinent surgical history.  Prior to Admission medications   Medication Sig Start Date End Date Taking? Authorizing Provider  azithromycin (ZITHROMAX) 250 MG tablet 2 tablets today, then 1 tablet for the next 4 days. 06/28/17   Deepika [place] B, FNP  benzonatate (TESSALON PERLES) 100 MG capsule Take 1 capsule (100 mg total) by mouth every 6 (six) hours as needed for cough. 06/22/17 06/22/18  Jeanmarie PlantMcShane, James A, MD  clindamycin (CLEOCIN) 150 MG capsule Take 1 capsule (150 mg total) by mouth 4 (four) times daily. 06/21/17   Joni ReiningSmith, Ronald K, PA-C  cyclobenzaprine (FLEXERIL) 10 MG tablet Take 1 tablet (10 mg total) by mouth 3 (three) times daily as needed. 01/15/17   Darci CurrentBrown, [place] N, MD  guaiFENesin-codeine 100-10 MG/5ML syrup Take 10 mLs by mouth 3 (three) times daily as needed. 06/28/17   Sihaam Chrobak, Rulon Eisenmengerari B, FNP  magic mouthwash w/lidocaine SOLN Take 5 mLs by mouth 4 (four) times daily. Swish and swallow 06/21/17   Joni ReiningSmith, Ronald K, PA-C    Allergies Bactrim [sulfamethoxazole-trimethoprim]  History reviewed.  No pertinent family history.  Social History Social History   Tobacco Use  . Smoking status: Never Smoker  . Smokeless tobacco: Never Used  Substance Use Topics  . Alcohol use: Yes  . Drug use: No    Review of Systems Constitutional: Negative for fever/chills ENT: Positive for sore throat. Cardiovascular: Denies chest pain. Respiratory: Negative for shortness of breath.  Positive for cough. Gastrointestinal: Negative for nausea, no vomiting.  Negative for diarrhea.  Musculoskeletal: Negative for body aches Skin: Negative for rash. Neurological: Negative for headaches ____________________________________________   PHYSICAL EXAM:  VITAL SIGNS: ED Triage Vitals [06/28/17 0829]  Enc Vitals Group     BP 116/79     Pulse Rate 79     Resp 18     Temp 98.2 F (36.8 C)     Temp Source Oral     SpO2 98 %     Weight 290 lb (131.5 kg)     Height 6\' 1"  (1.854 m)     Head Circumference      Peak Flow      Pain Score 0     Pain Loc      Pain Edu?      Excl. in GC?     Constitutional: Alert and oriented.  Well appearing and in no acute distress. Eyes: Conjunctivae of the left eye is erythematous. EOMI. Ears: Bilateral tympanic membranes appear normal. Nose: Sinus congestion noted; no rhinnorhea. Mouth/Throat: Mucous membranes are moist.  Oropharynx mildly erythematous. Tonsils 1+ without exudate. Neck: No  stridor.  Lymphatic: No cervical lymphadenopathy. Cardiovascular: Normal rate, regular rhythm. Good peripheral circulation. Respiratory: Normal respiratory effort.  No retractions.  Breath sounds clear to auscultation. Gastrointestinal: Soft and nontender.  Musculoskeletal: FROM x 4 extremities.  Neurologic:  Normal speech and language.  Skin:  Skin is warm, dry and intact. No rash noted. Psychiatric: Mood and affect are normal. Speech and behavior are normal.  ____________________________________________   LABS (all labs ordered are listed, but only abnormal results  are displayed)  Labs Reviewed - No data to display ____________________________________________  EKG  None ____________________________________________  RADIOLOGY  Not indicated. ____________________________________________   PROCEDURES  Procedure(s) performed: None  Critical Care performed: No ____________________________________________   INITIAL IMPRESSION / ASSESSMENT AND PLAN / ED COURSE  32 year old male presenting to the emergency department for treatment and evaluation of cough, congestion, and nasal drainage with left eye redness. He tells me that he hasn't been on antibiotics since onset of current symptoms even though it appears a prescription was written for Cleocin on 06/21/17. Today, he will be given a prescription for azithromycin and Robitussin AC. He was advised to fill and take the medications as prescribed. He is to follow up with the PCP of his choice for symptoms that are not improving over the next few days.   Medications - No data to display  ED Discharge Orders        Ordered    azithromycin (ZITHROMAX) 250 MG tablet     06/28/17 0932    guaiFENesin-codeine 100-10 MG/5ML syrup  3 times daily PRN     06/28/17 0932      Pertinent labs & imaging results that were available during my care of the patient were reviewed by me and considered in my medical decision making (see chart for details).    If controlled substance prescribed during this visit, 12 month history viewed on the [facility] prior to issuing an initial prescription for Schedule II or III opiod. ____________________________________________   FINAL CLINICAL IMPRESSION(S) / ED DIAGNOSES  Final diagnoses:  Upper respiratory tract infection, unspecified type  Acute maxillary sinusitis, recurrence not specified    Note:  This document was prepared using Dragon voice recognition software and may include unintentional dictation errors.     Chinita Pester, FNP 06/28/17 1133     Emily Filbert, MD 06/28/17 1146

## 2017-11-17 ENCOUNTER — Emergency Department
Admission: EM | Admit: 2017-11-17 | Discharge: 2017-11-17 | Disposition: A | Discharge status: Home / Self Care | Payer: Self-pay | Attending: Emergency Medicine | Admitting: Emergency Medicine

## 2017-11-17 ENCOUNTER — Other Ambulatory Visit: Payer: Self-pay

## 2017-11-17 DIAGNOSIS — M542 Cervicalgia: Secondary | ICD-10-CM | POA: Insufficient documentation

## 2017-11-17 MED ORDER — LIDOCAINE 5 % EX PTCH
1.0000 | MEDICATED_PATCH | CUTANEOUS | Status: DC
  Administered 2017-11-17: 1 via TRANSDERMAL
  Filled 2017-11-17: qty 1

## 2017-11-17 MED ORDER — KETOROLAC TROMETHAMINE 30 MG/ML IJ SOLN
30.0000 mg | Freq: Once | INTRAMUSCULAR | Status: AC
  Administered 2017-11-17: 30 mg via INTRAMUSCULAR
  Filled 2017-11-17: qty 1

## 2017-11-17 MED ORDER — KETOROLAC TROMETHAMINE 10 MG PO TABS: 10.0000 mg | ORAL_TABLET | Freq: Four times a day (QID) | ORAL | 0 refills | Status: DC | PRN

## 2017-11-17 MED ORDER — LIDOCAINE 5 % EX PTCH: 1.0000 | MEDICATED_PATCH | Freq: Two times a day (BID) | CUTANEOUS | 0 refills | Status: DC

## 2017-11-17 MED ORDER — CYCLOBENZAPRINE HCL 5 MG PO TABS: ORAL_TABLET | ORAL | 0 refills | Status: DC

## 2017-11-17 MED ORDER — ACETAMINOPHEN 325 MG PO TABS
650.0000 mg | ORAL_TABLET | Freq: Once | ORAL | Status: AC
  Administered 2017-11-17: 650 mg via ORAL
  Filled 2017-11-17: qty 2

## 2017-11-17 NOTE — ED Provider Notes (Signed)
[facility] Emergency Department Provider Note  ____________________________________________  Time seen: Approximately 7:34 AM  I have reviewed the triage vital signs and the nursing notes.   HISTORY  Chief Complaint Torticollis    HPI Chris Hart is a 32 y.o. male presents emergency department for evaluation of neck pain for 2 days.  Patient states that pain is at the base of his neck.  Pain does not radiate.  He had a headache earlier but this has improved.  No injury.  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  He had some nasal congestion a couple of weeks ago but this has resolved.  Vaccinations are up-to-date.  No IV drug use. He has taken Tylenol for pain.  No fever, chills, visual changes, shortness of breath, chest pain, arm pain, numbness, tingling.   No past medical history on file.  There are no active problems to display for this patient.   No past surgical history on file.  Prior to Admission medications   Medication Sig Start Date End Date Taking? Authorizing Provider  azithromycin (ZITHROMAX) 250 MG tablet 2 tablets today, then 1 tablet for the next 4 days. 06/28/17   Triplett, Cari B, FNP  benzonatate (TESSALON PERLES) 100 MG capsule Take 1 capsule (100 mg total) by mouth every 6 (six) hours as needed for cough. 06/22/17 06/22/18  Jeanmarie Plant, MD  clindamycin (CLEOCIN) 150 MG capsule Take 1 capsule (150 mg total) by mouth 4 (four) times daily. 06/21/17   Joni Reining, PA-C  cyclobenzaprine (FLEXERIL) 5 MG tablet Take 1-2 tablets 3 times daily as needed 11/17/17   Enid Derry, PA-C  guaiFENesin-codeine 100-10 MG/5ML syrup Take 10 mLs by mouth 3 (three) times daily as needed. 06/28/17   Triplett, Rulon Eisenmenger B, FNP  ketorolac (TORADOL) 10 MG tablet Take 1 tablet (10 mg total) by mouth every 6 (six) hours as needed. 11/17/17   Enid Derry, PA-C  lidocaine (LIDODERM) 5 % Place 1 patch onto the skin every 12  (twelve) hours. Remove & Discard patch within 12 hours or as directed by MD 11/17/17 11/17/18  Enid Derry, PA-C  magic mouthwash w/lidocaine SOLN Take 5 mLs by mouth 4 (four) times daily. Swish and swallow 06/21/17   Joni Reining, PA-C    Allergies Bactrim [sulfamethoxazole-trimethoprim]  No family history on file.  Social History Social History   Tobacco Use  . Smoking status: Never Smoker  . Smokeless tobacco: Never Used  Substance Use Topics  . Alcohol use: Yes  . Drug use: No     Review of Systems  Constitutional: No fever/chills Cardiovascular: No chest pain. Respiratory: No SOB. Gastrointestinal: No abdominal pain.  No nausea, no vomiting.   Musculoskeletal: Positive for neck pain Skin: Negative for rash, abrasions, lacerations, ecchymosis. Neurological: Negative for numbness or tingling   ____________________________________________   PHYSICAL EXAM:  VITAL SIGNS: ED Triage Vitals [11/17/17 0651]  Enc Vitals Group     BP (!) 135/96     Pulse Rate 86     Resp 20     Temp 98.2 F (36.8 C)     Temp Source Oral     SpO2 95 %     Weight 300 lb (136.1 kg)     Height  (1.854 m)     Head Circumference      Peak Flow      Pain Score 6     Pain Loc  Pain Edu?      Excl. in GC?      Constitutional: Alert and oriented. Well appearing and in no acute distress. Eyes: Conjunctivae are normal. PERRL. EOMI. Head: Atraumatic. ENT:      Ears:      Nose: No congestion/rhinnorhea.      Mouth/Throat: Mucous membranes are moist.  Neck: No stridor. Inferior cervical spine tenderness to palpation.  Limited range of motion due to pain.  Cardiovascular: Normal rate, regular rhythm.  Good peripheral circulation. Respiratory: Normal respiratory effort without tachypnea or retractions. Lungs CTAB. Good air entry to the bases with no decreased or absent breath sounds. Musculoskeletal: Full range of motion to all extremities. No gross deformities  appreciated. Neurologic:  Normal speech and language. No gross focal neurologic deficits are appreciated.  Skin:  Skin is warm, dry and intact. No rash noted. Psychiatric: Mood and affect are normal. Speech and behavior are normal. Patient exhibits appropriate insight and judgement.   ____________________________________________   LABS (all labs ordered are listed, but only abnormal results are displayed)  Labs Reviewed - No data to display ____________________________________________  EKG   ____________________________________________  RADIOLOGY   No results found.  ____________________________________________    PROCEDURES  Procedure(s) performed:    Procedures    Medications  lidocaine (LIDODERM) 5 % 1 patch (1 patch Transdermal Patch Applied 11/17/17 0806)  ketorolac (TORADOL) 30 MG/ML injection 30 mg (30 mg Intramuscular Given 11/17/17 0805)  acetaminophen (TYLENOL) tablet 650 mg (650 mg Oral Given 11/17/17 0805)     ____________________________________________   INITIAL IMPRESSION / ASSESSMENT AND PLAN / ED COURSE  Pertinent labs & imaging results that were available during my care of the patient were reviewed by me and considered in my medical decision making (see chart for details).  Review of the  CSRS was performed in accordance of the [facility] prior to dispensing any controlled drugs.     Patient presents emergency department for evaluation of neck pain.  Vital exam are reassuring.  Neck pain improved significantly with Toradol, Lidoderm, Tylenol.  Patient had full range of motion of neck after medication.  Patient will be discharged home with prescriptions for Toradol, Flexeril, Lidoderm. Patient is to follow up with PCP as directed. Patient is given ED precautions to return to the ED for any worsening or new symptoms.     ____________________________________________  FINAL CLINICAL IMPRESSION(S) / ED DIAGNOSES  Final diagnoses:  Neck pain       NEW MEDICATIONS STARTED DURING THIS VISIT:  ED Discharge Orders        Ordered    ketorolac (TORADOL) 10 MG tablet  Every 6 hours PRN     11/17/17 0838    cyclobenzaprine (FLEXERIL) 5 MG tablet     11/17/17 0838    lidocaine (LIDODERM) 5 %  Every 12 hours     11/17/17 6045          This chart was dictated using voice recognition software/Dragon. Despite best efforts to proofread, errors can occur which can change the meaning. Any change was purely unintentional. .awhp   Enid Derry, PA-C 11/17/17 1649    Arnaldo Natal, MD 11/18/17 1538

## 2017-11-17 NOTE — ED Triage Notes (Signed)
Reports woke Friday with neck stiff, today pain is worse.  Denies any type of injury.

## 2017-11-17 NOTE — ED Notes (Signed)
Pt states that yesterday he woke up with stiff neck - today he is unable to turn his neck from side to side or up and down - pt c/o headache - denies N/V

## 2017-11-23 ENCOUNTER — Encounter: Payer: Self-pay | Admitting: Emergency Medicine

## 2017-11-23 ENCOUNTER — Other Ambulatory Visit: Payer: Self-pay

## 2017-11-23 ENCOUNTER — Emergency Department
Admission: EM | Admit: 2017-11-23 | Discharge: 2017-11-23 | Disposition: A | Discharge status: Home / Self Care | Payer: Self-pay | Attending: Emergency Medicine | Admitting: Emergency Medicine

## 2017-11-23 DIAGNOSIS — K529 Noninfective gastroenteritis and colitis, unspecified: Secondary | ICD-10-CM | POA: Insufficient documentation

## 2017-11-23 DIAGNOSIS — Z79899 Other long term (current) drug therapy: Secondary | ICD-10-CM | POA: Insufficient documentation

## 2017-11-23 DIAGNOSIS — R1084 Generalized abdominal pain: Secondary | ICD-10-CM | POA: Insufficient documentation

## 2017-11-23 MED ORDER — ONDANSETRON 4 MG PO TBDP
4.0000 mg | ORAL_TABLET | Freq: Once | ORAL | Status: AC
  Administered 2017-11-23: 4 mg via ORAL
  Filled 2017-11-23: qty 1

## 2017-11-23 MED ORDER — ACETAMINOPHEN 325 MG PO TABS
650.0000 mg | ORAL_TABLET | Freq: Once | ORAL | Status: AC
Start: 2017-11-23 — End: 2017-11-23
  Administered 2017-11-23: 650 mg via ORAL
  Filled 2017-11-23: qty 2

## 2017-11-23 MED ORDER — ONDANSETRON 4 MG PO TBDP: 4.0000 mg | ORAL_TABLET | Freq: Three times a day (TID) | ORAL | 0 refills | Status: DC | PRN

## 2017-11-23 MED ORDER — LOPERAMIDE HCL 2 MG PO TABS: 2.0000 mg | ORAL_TABLET | Freq: Four times a day (QID) | ORAL | 0 refills | Status: DC | PRN

## 2017-11-23 NOTE — ED Notes (Signed)
Pt asleep in bed at this time.

## 2017-11-23 NOTE — ED Notes (Signed)
Up to bathroom to attempt for stool sample.

## 2017-11-23 NOTE — ED Notes (Signed)
Attempted to provide stool sample, unable to at this time.

## 2017-11-23 NOTE — ED Provider Notes (Signed)
[facility] Emergency Department Provider Note  Time seen: 7:46 AM  I have reviewed the triage vital signs and the nursing notes.   HISTORY  Chief Complaint Abdominal Pain and Diarrhea    HPI Chris Hart is a 32 y.o. male with no significant past medical history presents to the emergency department for nausea and diarrhea.  According to the patient his daughter woke up with nausea and vomiting around 11 PM last night.  He states around 2:00 in the morning he woke up with nausea and diarrhea.  States he felt like he was going to vomit but has not vomited.  Has had for 5 episodes of very loose diarrhea this morning.  Daughter is also here with the patient for nausea vomiting.  He states both of them had forehead temperatures of 101 degrees this morning but did not take Tylenol or ibuprofen and are both afebrile in the emergency department.  Patient states they all ate the same thing last night.  States his wife is not having symptoms, but he believes she did not eat the salad which was romaine lettuce.  Currently patient states he feels better, continues to state mild nausea.   History reviewed. No pertinent past medical history.  There are no active problems to display for this patient.   History reviewed. No pertinent surgical history.  Prior to Admission medications   Medication Sig Start Date End Date Taking? Authorizing Provider  azithromycin (ZITHROMAX) 250 MG tablet 2 tablets today, then 1 tablet for the next 4 days. 06/28/17   Triplett, Cari B, FNP  benzonatate (TESSALON PERLES) 100 MG capsule Take 1 capsule (100 mg total) by mouth every 6 (six) hours as needed for cough. 06/22/17 06/22/18  Jeanmarie PlantMcShane, James A, MD  clindamycin (CLEOCIN) 150 MG capsule Take 1 capsule (150 mg total) by mouth 4 (four) times daily. 06/21/17   Joni ReiningSmith, Ronald K, PA-C  cyclobenzaprine (FLEXERIL) 5 MG tablet Take 1-2 tablets 3 times daily as needed 11/17/17   Enid DerryWagner, Ashley, PA-C   guaiFENesin-codeine 100-10 MG/5ML syrup Take 10 mLs by mouth 3 (three) times daily as needed. 06/28/17   Triplett, Rulon Eisenmengerari B, FNP  ketorolac (TORADOL) 10 MG tablet Take 1 tablet (10 mg total) by mouth every 6 (six) hours as needed. 11/17/17   Enid DerryWagner, Ashley, PA-C  lidocaine (LIDODERM) 5 % Place 1 patch onto the skin every 12 (twelve) hours. Remove & Discard patch within 12 hours or as directed by MD 11/17/17 11/17/18  Enid DerryWagner, Ashley, PA-C  magic mouthwash w/lidocaine SOLN Take 5 mLs by mouth 4 (four) times daily. Swish and swallow 06/21/17   Joni ReiningSmith, Ronald K, PA-C    Allergies  Allergen Reactions  . Bactrim [Sulfamethoxazole-Trimethoprim] Itching and Rash    History reviewed. No pertinent family history.  Social History Social History   Tobacco Use  . Smoking status: Never Smoker  . Smokeless tobacco: Never Used  Substance Use Topics  . Alcohol use: Yes  . Drug use: No    Review of Systems Constitutional: Fever this morning. Eyes: Negative for visual complaints ENT: Negative for recent illness/congestion Cardiovascular: Negative for chest pain. Respiratory: Negative for shortness of breath. Gastrointestinal: Abdominal cramping.  Positive for nausea.  Negative for vomiting.  Positive for diarrhea x4 5 episodes this morning. Genitourinary: Negative for urinary compaints Musculoskeletal: Negative for musculoskeletal complaints Skin: Negative for skin complaints  Neurological: Negative for headache All other ROS negative  ____________________________________________   PHYSICAL EXAM:  VITAL SIGNS: ED Triage Vitals  Enc Vitals Group     BP 11/23/17 0734 119/88     Pulse Rate 11/23/17 0733 (!) 112     Resp 11/23/17 0733 18     Temp 11/23/17 0733 98.6 F (37 C)     Temp Source 11/23/17 0733 Oral     SpO2 11/23/17 0733 92 %     Weight 11/23/17 0733 300 lb (136.1 kg)     Height 11/23/17 0733 6\' 1"  (1.854 m)     Head Circumference --      Peak Flow --      Pain Score 11/23/17  0733 0     Pain Loc --      Pain Edu? --      Excl. in GC? --     Constitutional: Alert and oriented. Well appearing and in no distress. Eyes: Normal exam ENT   Head: Normocephalic and atraumatic   Mouth/Throat: Mucous membranes are moist. Cardiovascular: Normal rate, regular rhythm around 100 bpm. Respiratory: Normal respiratory effort without tachypnea nor retractions. Breath sounds are clear  Gastrointestinal: Soft and nontender. No distention. Musculoskeletal: Nontender with normal range of motion in all extremities. Neurologic:  Normal speech and language. No gross focal neurologic deficits Skin:  Skin is warm, dry and intact.  Psychiatric: Mood and affect are normal.   ____________________________________________   INITIAL IMPRESSION / ASSESSMENT AND PLAN / ED COURSE  Pertinent labs & imaging results that were available during my care of the patient were reviewed by me and considered in my medical decision making (see chart for details).  Patient presents to the emergency department with nausea abdominal cramping and diarrhea.  States his daughter developed symptoms around 9 PM, he woke up at 2 AM with similar symptoms.  Has had for 5 episodes of very loose diarrhea.  Possible fever this morning although states he did not take anything and he is afebrile in the emergency department.  Thinks he could have a malfunctioning thermometer.  Symptoms are most consistent with gastroenteritis.  We will dose Zofran and attempt to orally hydrate.  If the patient is able to provide a stool sample we will check a stool sample as well.  Overall very well-appearing, benign abdominal exam.  She has not been able to produce a bowel movement.  States he feels better.  I discussed likely gastroenteritis, supportive care at home including Zofran, loperamide plenty fluids and rest.  I also discussed return precautions for any return of/worsening abdominal pain or significant  fever.  ____________________________________________   FINAL CLINICAL IMPRESSION(S) / ED DIAGNOSES  Gastroenteritis    Minna Antis, MD 11/23/17 1026

## 2017-11-23 NOTE — ED Notes (Signed)
Attempted for stool sample, unable to provide any at this time.

## 2017-11-23 NOTE — ED Notes (Signed)
Drank fluids without emesis.  

## 2017-11-23 NOTE — ED Notes (Signed)
Pt alert and oriented X4, active, cooperative, pt in NAD. RR even and unlabored, color WNL.  Pt informed to return if any life threatening symptoms occur.  Discharge and followup instructions reviewed.  

## 2017-11-23 NOTE — ED Triage Notes (Signed)
C/o abdominal cramping and diarrhea since last night.  Fever 101.7 this AM per dad.  Denies vomiting.

## 2017-12-24 ENCOUNTER — Emergency Department: Payer: Self-pay

## 2017-12-24 ENCOUNTER — Other Ambulatory Visit: Payer: Self-pay

## 2017-12-24 ENCOUNTER — Emergency Department
Admission: EM | Admit: 2017-12-24 | Discharge: 2017-12-24 | Disposition: A | Discharge status: Home / Self Care | Payer: Self-pay | Attending: Emergency Medicine | Admitting: Emergency Medicine

## 2017-12-24 DIAGNOSIS — S99922A Unspecified injury of left foot, initial encounter: Secondary | ICD-10-CM

## 2017-12-24 DIAGNOSIS — S99921A Unspecified injury of right foot, initial encounter: Secondary | ICD-10-CM | POA: Insufficient documentation

## 2017-12-24 DIAGNOSIS — Y92242 Post office as the place of occurrence of the external cause: Secondary | ICD-10-CM | POA: Insufficient documentation

## 2017-12-24 DIAGNOSIS — Z79899 Other long term (current) drug therapy: Secondary | ICD-10-CM | POA: Insufficient documentation

## 2017-12-24 DIAGNOSIS — Y939 Activity, unspecified: Secondary | ICD-10-CM | POA: Insufficient documentation

## 2017-12-24 DIAGNOSIS — Y999 Unspecified external cause status: Secondary | ICD-10-CM | POA: Insufficient documentation

## 2017-12-24 DIAGNOSIS — X501XXA Overexertion from prolonged static or awkward postures, initial encounter: Secondary | ICD-10-CM | POA: Insufficient documentation

## 2017-12-24 MED ORDER — KETOROLAC TROMETHAMINE 30 MG/ML IJ SOLN
30.0000 mg | Freq: Once | INTRAMUSCULAR | Status: AC
  Administered 2017-12-24: 30 mg via INTRAMUSCULAR
  Filled 2017-12-24: qty 1

## 2017-12-24 MED ORDER — KETOROLAC TROMETHAMINE 10 MG PO TABS: 10.0000 mg | ORAL_TABLET | Freq: Four times a day (QID) | ORAL | 0 refills | Status: DC | PRN

## 2017-12-24 NOTE — ED Notes (Signed)
See triage note  Presents with pain to lateral left foot  States pain started about 1 month ago w/o injury  Ambulates with slight limp

## 2017-12-24 NOTE — ED Provider Notes (Signed)
[facility] Emergency Department Provider Note  ____________________________________________  Time seen: Approximately 8:36 AM  I have reviewed the triage vital signs and the nursing notes.   HISTORY  Chief Complaint Foot Pain    HPI Chris Hart is a 32 y.o. male that presents to the emergency department for evaluation of left foot pain for 1 month.  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  He rolled his foot inward.  He has had pain on the outside of his foot since.  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  He has occasionally taken Tylenol for pain.  No numbness, tingling.  No past medical history on file.  There are no active problems to display for this patient.   No past surgical history on file.  Prior to Admission medications   Medication Sig Start Date End Date Taking? Authorizing Provider  azithromycin (ZITHROMAX) 250 MG tablet 2 tablets today, then 1 tablet for the next 4 days. 06/28/17   Triplett, Cari B, FNP  benzonatate (TESSALON PERLES) 100 MG capsule Take 1 capsule (100 mg total) by mouth every 6 (six) hours as needed for cough. 06/22/17 06/22/18  Jeanmarie PlantMcShane, James A, MD  clindamycin (CLEOCIN) 150 MG capsule Take 1 capsule (150 mg total) by mouth 4 (four) times daily. 06/21/17   Joni ReiningSmith, Ronald K, PA-C  cyclobenzaprine (FLEXERIL) 5 MG tablet Take 1-2 tablets 3 times daily as needed 11/17/17   Enid DerryWagner, Harper Vandervoort, PA-C  guaiFENesin-codeine 100-10 MG/5ML syrup Take 10 mLs by mouth 3 (three) times daily as needed. 06/28/17   Triplett, Rulon Eisenmengerari B, FNP  ketorolac (TORADOL) 10 MG tablet Take 1 tablet (10 mg total) by mouth every 6 (six) hours as needed. 12/24/17   Enid DerryWagner, Tej Murdaugh, PA-C  lidocaine (LIDODERM) 5 % Place 1 patch onto the skin every 12 (twelve) hours. Remove & Discard patch within 12 hours or as directed  by MD 11/17/17 11/17/18  Enid DerryWagner, Ravenne Wayment, PA-C  loperamide (IMODIUM A-D) 2 MG tablet Take 1 tablet (2 mg total) by mouth 4 (four) times daily as needed for diarrhea or loose stools. 11/23/17   Minna AntisPaduchowski, Kevin, MD  magic mouthwash w/lidocaine SOLN Take 5 mLs by mouth 4 (four) times daily. Swish and swallow 06/21/17   Joni ReiningSmith, Ronald K, PA-C  ondansetron (ZOFRAN ODT) 4 MG disintegrating tablet Take 1 tablet (4 mg total) by mouth every 8 (eight) hours as needed for nausea or vomiting. 11/23/17   Minna AntisPaduchowski, Kevin, MD    Allergies Bactrim [sulfamethoxazole-trimethoprim]  No family history on file.  Social History Social History   Tobacco Use  . Smoking status: Never Smoker  . Smokeless tobacco: Never Used  Substance Use Topics  . Alcohol use: Yes  . Drug use: No     Review of Systems  Constitutional: No fever/chills Gastrointestinal: No nausea, no vomiting.  Musculoskeletal: Positive for foot pain. Skin: Negative for rash, abrasions, lacerations, ecchymosis. Neurological: Negative for numbness or tingling   ____________________________________________   PHYSICAL EXAM:  VITAL SIGNS: ED Triage Vitals  Enc Vitals Group     BP 12/24/17 0648 128/83     Pulse Rate 12/24/17 0648 91     Resp 12/24/17 0648 16     Temp 12/24/17 0648 98.1 F (36.7 C)     Temp Source 12/24/17 0648 Oral  SpO2 12/24/17 0648 97 %     Weight 12/24/17 0645 290 lb (131.5 kg)     Height 12/24/17 0645 6\' 1"  (1.854 m)     Head Circumference --      Peak Flow --      Pain Score 12/24/17 0645 6     Pain Loc --      Pain Edu? --      Excl. in GC? --      Constitutional: Alert and oriented. Well appearing and in no acute distress. Eyes: Conjunctivae are normal. PERRL. EOMI. Head: Atraumatic. ENT:      Ears:      Nose: No congestion/rhinnorhea.      Mouth/Throat: Mucous membranes are moist.  Neck: No stridor.   Cardiovascular: Normal rate, regular rhythm.  Good peripheral circulation. Respiratory:  Normal respiratory effort without tachypnea or retractions. Lungs CTAB. Good air entry to the bases with no decreased or absent breath sounds. Musculoskeletal: Full range of motion to all extremities. No gross deformities appreciated.  Full range of motion of foot.  Tenderness to palpation over dorsal lateral left foot.  Pain elicited with external and internal rotation of left ankle.  No swelling or bruising. Neurologic:  Normal speech and language. No gross focal neurologic deficits are appreciated.  Skin:  Skin is warm, dry and intact. No rash noted. Psychiatric: Mood and affect are normal. Speech and behavior are normal. Patient exhibits appropriate insight and judgement.   ____________________________________________   LABS (all labs ordered are listed, but only abnormal results are displayed)  Labs Reviewed - No data to display ____________________________________________  EKG   ____________________________________________  RADIOLOGY Lexine Baton, personally viewed and evaluated these images (plain radiographs) as part of my medical decision making, as well as reviewing the written report by the radiologist.  Dg Foot Complete Left  Result Date: 12/24/2017 CLINICAL DATA:  Left lateral foot pain starting a month ago after an injury. EXAM: LEFT FOOT - COMPLETE 3+ VIEW COMPARISON:  None. FINDINGS: There is no evidence of fracture or dislocation. There is no evidence of arthropathy or other focal bone abnormality. Soft tissues are unremarkable. IMPRESSION: Negative. Electronically Signed   By: Marnee Spring M.D.   On: 12/24/2017 08:22    ____________________________________________    PROCEDURES  Procedure(s) performed:    Procedures    Medications  ketorolac (TORADOL) 30 MG/ML injection 30 mg (has no administration in time range)     ____________________________________________   INITIAL IMPRESSION / ASSESSMENT AND PLAN / ED COURSE  Pertinent labs & imaging  results that were available during my care of the patient were reviewed by me and considered in my medical decision making (see chart for details).  Review of the [place] CSRS was performed in accordance of the [facility] prior to dispensing any controlled drugs.   Patient's diagnosis is consistent with sprain 1 month ago.  Vital signs and exam are reassuring.  X-ray negative for acute bony abnormality.  IM Toradol was given.  Patient will purchase over-the-counter foot brace.  Patient will be discharged home with prescriptions for Toradol. Patient is to follow up with podiatry as directed. Patient is given ED precautions to return to the ED for any worsening or new symptoms.     ____________________________________________  FINAL CLINICAL IMPRESSION(S) / ED DIAGNOSES  Final diagnoses:  Injury of left foot, initial encounter      NEW MEDICATIONS STARTED DURING THIS VISIT:  ED Discharge Orders        Ordered  ketorolac (TORADOL) 10 MG tablet  Every 6 hours PRN     12/24/17 0832          This chart was dictated using voice recognition software/Dragon. Despite best efforts to proofread, errors can occur which can change the meaning. Any change was purely unintentional.    Enid Derry, PA-C 12/24/17 1333    Darci Current, MD 12/27/17 [phone]

## 2017-12-24 NOTE — ED Triage Notes (Signed)
Patient reports left foot pain for approximately 1 month.  Reports does remember stepping off curb wrong.

## 2018-07-05 ENCOUNTER — Emergency Department: Payer: 59

## 2018-07-05 ENCOUNTER — Encounter: Payer: Self-pay | Admitting: Emergency Medicine

## 2018-07-05 ENCOUNTER — Emergency Department
Admission: EM | Admit: 2018-07-05 | Discharge: 2018-07-05 | Disposition: A | Discharge status: Home / Self Care | Payer: 59 | Attending: Pediatrics | Admitting: Pediatrics

## 2018-07-05 ENCOUNTER — Other Ambulatory Visit: Payer: Self-pay

## 2018-07-05 DIAGNOSIS — M79602 Pain in left arm: Secondary | ICD-10-CM | POA: Diagnosis present

## 2018-07-05 DIAGNOSIS — M5412 Radiculopathy, cervical region: Secondary | ICD-10-CM | POA: Diagnosis not present

## 2018-07-05 MED ORDER — PREDNISONE 10 MG (21) PO TBPK: ORAL_TABLET | ORAL | 0 refills | Status: DC

## 2018-07-05 MED ORDER — METHOCARBAMOL 500 MG PO TABS
1000.0000 mg | ORAL_TABLET | Freq: Once | ORAL | Status: AC
  Administered 2018-07-05: 1000 mg via ORAL
  Filled 2018-07-05: qty 2

## 2018-07-05 MED ORDER — METHOCARBAMOL 500 MG PO TABS: 500.0000 mg | ORAL_TABLET | Freq: Three times a day (TID) | ORAL | 0 refills | Status: AC | PRN

## 2018-07-05 MED ORDER — METHOCARBAMOL 1000 MG/10ML IJ SOLN: 1000.0000 mg | Freq: Once | INTRAMUSCULAR | Status: DC

## 2018-07-05 MED ORDER — DEXAMETHASONE SODIUM PHOSPHATE 10 MG/ML IJ SOLN
10.0000 mg | Freq: Once | INTRAMUSCULAR | Status: AC
  Administered 2018-07-05: 10 mg via INTRAMUSCULAR
  Filled 2018-07-05: qty 1

## 2018-07-05 NOTE — ED Notes (Signed)
See triage note  Presents with back pain    Pain started couple of days ago  No injury    Pain is in upper back and left shoulder area

## 2018-07-05 NOTE — ED Triage Notes (Signed)
C/O left upper back and shoulder pain x 1 day.

## 2018-07-05 NOTE — ED Provider Notes (Signed)
[facility] Emergency Department Provider Note  ____________________________________________  Time seen: Approximately 5:18 PM  I have reviewed the triage vital signs and the nursing notes.   HISTORY  Chief Complaint Back Pain    HPI Chris Hart is a 33 y.o. male presents to the emergency department with cervical radiculopathy of the left upper extremity for the past 2 days.  Patient reports his pain is 8 out of 10 in intensity and sharp traveling from the left upper trapezius down the posterior aspect of the left arm into the left hand.  Patient reports the pain is worsened with range of motion at the neck.  Patient experienced similar symptoms in October 2018 and had an x-ray of the cervical spine which revealed no bony abnormality.  Patient reports that he was sent home on tapered steroid and his pain eventually went away.  Patient states that 2 days ago he was closing the patch on a car when pain suddenly returned.  He has tried ibuprofen at home which has not relieved his symptoms.  He denies changes in grip strength or general weakness.  No new falls or traumas.  No other alleviating measures have been attempted.   History reviewed. No pertinent past medical history.  There are no active problems to display for this patient.   History reviewed. No pertinent surgical history.  Prior to Admission medications   Medication Sig Start Date End Date Taking? Authorizing Provider  azithromycin (ZITHROMAX) 250 MG tablet 2 tablets today, then 1 tablet for the next 4 days. 06/28/17   Triplett, Rulon Eisenmenger B, FNP  clindamycin (CLEOCIN) 150 MG capsule Take 1 capsule (150 mg total) by mouth 4 (four) times daily. 06/21/17   Joni Reining, PA-C  cyclobenzaprine (FLEXERIL) 5 MG tablet Take 1-2 tablets 3 times daily as needed 11/17/17   Enid Derry, PA-C  guaiFENesin-codeine 100-10 MG/5ML syrup Take 10 mLs by mouth 3 (three) times daily as needed. 06/28/17   Triplett, Rulon Eisenmenger  B, FNP  ketorolac (TORADOL) 10 MG tablet Take 1 tablet (10 mg total) by mouth every 6 (six) hours as needed. 12/24/17   Enid Derry, PA-C  lidocaine (LIDODERM) 5 % Place 1 patch onto the skin every 12 (twelve) hours. Remove & Discard patch within 12 hours or as directed by MD 11/17/17 11/17/18  Enid Derry, PA-C  loperamide (IMODIUM A-D) 2 MG tablet Take 1 tablet (2 mg total) by mouth 4 (four) times daily as needed for diarrhea or loose stools. 11/23/17   Minna Antis, MD  magic mouthwash w/lidocaine SOLN Take 5 mLs by mouth 4 (four) times daily. Swish and swallow 06/21/17   Joni Reining, PA-C  methocarbamol (ROBAXIN) 500 MG tablet Take 1 tablet (500 mg total) by mouth every 8 (eight) hours as needed for up to 5 days. 07/05/18 07/10/18  Orvil Feil, PA-C  ondansetron (ZOFRAN ODT) 4 MG disintegrating tablet Take 1 tablet (4 mg total) by mouth every 8 (eight) hours as needed for nausea or vomiting. 11/23/17   Minna Antis, MD  predniSONE (STERAPRED UNI-PAK 21 TAB) 10 MG (21) TBPK tablet Take 6 tabs the the 1st day. Take 6 tabs the the 2nd day. Take 5 tabs the the 3rd day. Take 5 tabs the 4th day. Take 4 tabs the the 5th day.Take 4 tabs the the 6th day.Take 3 tabs the 7th day.Take 3 tabs the 8th day. Take 2 tabs the 9th day. Take 2 tabs the 10th day. Take 1 tab the 11th  day. Take 1 tab the 12th day. 07/05/18   Orvil FeilWoods, Senica Crall M, PA-C    Allergies Bactrim [sulfamethoxazole-trimethoprim]  No family history on file.  Social History Social History   Tobacco Use  . Smoking status: Never Smoker  . Smokeless tobacco: Never Used  Substance Use Topics  . Alcohol use: Yes  . Drug use: No     Review of Systems  Constitutional: No fever/chills Eyes: No visual changes. No discharge ENT: No upper respiratory complaints. Cardiovascular: no chest pain. Respiratory: no cough. No SOB. Gastrointestinal: No abdominal pain.  No nausea, no vomiting.  No diarrhea.  No  constipation. Genitourinary: Negative for dysuria. No hematuria Musculoskeletal: Patient has left upper extremity radiculopathy. Skin: Negative for rash, abrasions, lacerations, ecchymosis. Neurological: Negative for headaches, focal weakness or numbness.   ____________________________________________   PHYSICAL EXAM:  VITAL SIGNS: ED Triage Vitals [07/05/18 1522]  Enc Vitals Group     BP      Pulse      Resp      Temp      Temp src      SpO2      Weight 289 lb 14.5 oz (131.5 kg)     Height      Head Circumference      Peak Flow      Pain Score 8     Pain Loc      Pain Edu?      Excl. in GC?      Constitutional: Alert and oriented. Well appearing and in no acute distress. Eyes: Conjunctivae are normal. PERRL. EOMI. Head: Atraumatic. Neck: Patient's left upper extremity radiculopathy is reproduced with range of motion of the neck. Cardiovascular: Normal rate, regular rhythm. Normal S1 and S2.  Good peripheral circulation. Respiratory: Normal respiratory effort without tachypnea or retractions. Lungs CTAB. Good air entry to the bases with no decreased or absent breath sounds. Gastrointestinal: Bowel sounds 4 quadrants. Soft and nontender to palpation. No guarding or rigidity. No palpable masses. No distention. No CVA tenderness. Musculoskeletal: Full range of motion to all extremities. No gross deformities appreciated.  No left-sided rotator cuff weakness with testing. Palpable radial pulse bilaterally and symmetrically.  Neurologic:  Normal speech and language. No gross focal neurologic deficits are appreciated.  Skin:  Skin is warm, dry and intact. No rash noted. Psychiatric: Mood and affect are normal. Speech and behavior are normal. Patient exhibits appropriate insight and judgement.   ____________________________________________   LABS (all labs ordered are listed, but only abnormal results are displayed)  Labs Reviewed - No data to  display ____________________________________________  EKG   ____________________________________________  RADIOLOGY I personally viewed and evaluated these images as part of my medical decision making, as well as reviewing the written report by the radiologist.    Ct Cervical Spine Wo Contrast  Result Date: 07/05/2018 CLINICAL DATA:  Left-sided neck pain starting Wednesday and worsening today. EXAM: CT CERVICAL SPINE WITHOUT CONTRAST TECHNIQUE: Multidetector CT imaging of the cervical spine was performed without intravenous contrast. Multiplanar CT image reconstructions were also generated. COMPARISON:  None. FINDINGS: Alignment: Slight reversal of cervical lordosis with the apex at C6. Skull base and vertebrae: No acute fracture. No primary bone lesion or focal pathologic process. Soft tissues and spinal canal: No prevertebral fluid or swelling. No visible canal hematoma. Disc levels: No focal disc herniation, significant central or foraminal stenosis identified. Upper chest: Clear Other: None IMPRESSION: 1. No acute cervical spine fracture or listhesis. 2. Slight reversal of cervical lordosis  with the apex at C6. This may be seen with muscle spasm or patient positioning. Electronically Signed   By: Tollie Ethavid  Kwon M.D.   On: 07/05/2018 17:36    ____________________________________________    PROCEDURES  Procedure(s) performed:    Procedures    Medications  dexamethasone (DECADRON) injection 10 mg (10 mg Intramuscular Given 07/05/18 1720)  methocarbamol (ROBAXIN) tablet 1,000 mg (1,000 mg Oral Given 07/05/18 1719)     ____________________________________________   INITIAL IMPRESSION / ASSESSMENT AND PLAN / ED COURSE  Pertinent labs & imaging results that were available during my care of the patient were reviewed by me and considered in my medical decision making (see chart for details).  Review of the [place] CSRS was performed in accordance of the [facility] prior to dispensing any  controlled drugs.      Assessment and plan:  Cervical Radiculopathy:  Patient presents to the emergency department with left upper extremity radiculopathy that was reproduced with range of motion at the neck.  CT of the cervical spine revealed no acute bony abnormality.  Patient reported that his pain improved in the emergency department with Decadron and Robaxin.  Patient was discharged with tapered prednisone and Robaxin.  He was given a referral to neurosurgery, Dr. Marcell BarlowYarborough.  All patient questions were answered.   ____________________________________________  FINAL CLINICAL IMPRESSION(S) / ED DIAGNOSES  Final diagnoses:  Cervical radiculopathy      NEW MEDICATIONS STARTED DURING THIS VISIT:  ED Discharge Orders         Ordered    predniSONE (STERAPRED UNI-PAK 21 TAB) 10 MG (21) TBPK tablet     07/05/18 1816    methocarbamol (ROBAXIN) 500 MG tablet  Every 8 hours PRN     07/05/18 1816              This chart was dictated using voice recognition software/Dragon. Despite best efforts to proofread, errors can occur which can change the meaning. Any change was purely unintentional.    Orvil FeilWoods, Nels Munn M, PA-C 07/05/18 [id number]    Goodman, Graydon, MD 07/05/18 2039

## 2018-07-08 ENCOUNTER — Emergency Department
Admission: EM | Admit: 2018-07-08 | Discharge: 2018-07-08 | Disposition: A | Discharge status: Home / Self Care | Payer: 59 | Attending: Emergency Medicine | Admitting: Emergency Medicine

## 2018-07-08 ENCOUNTER — Other Ambulatory Visit: Payer: Self-pay

## 2018-07-08 DIAGNOSIS — Z79899 Other long term (current) drug therapy: Secondary | ICD-10-CM | POA: Diagnosis not present

## 2018-07-08 DIAGNOSIS — M5412 Radiculopathy, cervical region: Secondary | ICD-10-CM | POA: Insufficient documentation

## 2018-07-08 DIAGNOSIS — M436 Torticollis: Secondary | ICD-10-CM | POA: Diagnosis present

## 2018-07-08 MED ORDER — HYDROMORPHONE HCL 1 MG/ML IJ SOLN
1.0000 mg | Freq: Once | INTRAMUSCULAR | Status: AC
  Administered 2018-07-08: 1 mg via INTRAMUSCULAR
  Filled 2018-07-08: qty 1

## 2018-07-08 MED ORDER — TRAMADOL HCL 50 MG PO TABS: 50.0000 mg | ORAL_TABLET | Freq: Two times a day (BID) | ORAL | 0 refills | Status: DC | PRN

## 2018-07-08 MED ORDER — ORPHENADRINE CITRATE 30 MG/ML IJ SOLN
60.0000 mg | Freq: Two times a day (BID) | INTRAMUSCULAR | Status: DC
  Administered 2018-07-08: 60 mg via INTRAMUSCULAR
  Filled 2018-07-08: qty 2

## 2018-07-08 NOTE — ED Triage Notes (Signed)
Patient reports left sided neck, shoulder and arm pain.  States seen on Friday for same and no better.

## 2018-07-08 NOTE — ED Provider Notes (Signed)
[facility] Emergency Department Provider Note   ____________________________________________   First MD Initiated Contact with Patient 07/08/18 [phone]     (approximate)  I have reviewed the triage vital signs and the nursing notes.   HISTORY  Chief Complaint Torticollis    HPI Chris Hart is a 33 y.o. male patient presents for continue radiculopathy from the neck to the left upper extremity.  This patient second visit for complaint has not follow-up with neurology as directed.  Patient rates the pain as a 9/10.  Patient had intermitting pain and numbness.  Reviewed CT findings with patient showing no acute process except for findings consistent with muscle strain.  Patient state he is continue taking steroids and muscle relaxers.  No past medical history on file.  There are no active problems to display for this patient.   No past surgical history on file.  Prior to Admission medications   Medication Sig Start Date End Date Taking? Authorizing Provider  azithromycin (ZITHROMAX) 250 MG tablet 2 tablets today, then 1 tablet for the next 4 days. 06/28/17   Triplett, Rulon Eisenmengerari B, FNP  clindamycin (CLEOCIN) 150 MG capsule Take 1 capsule (150 mg total) by mouth 4 (four) times daily. 06/21/17   Joni ReiningSmith, Ronald K, PA-C  cyclobenzaprine (FLEXERIL) 5 MG tablet Take 1-2 tablets 3 times daily as needed 11/17/17   Enid DerryWagner, Ashley, PA-C  guaiFENesin-codeine 100-10 MG/5ML syrup Take 10 mLs by mouth 3 (three) times daily as needed. 06/28/17   Triplett, Rulon Eisenmengerari B, FNP  ketorolac (TORADOL) 10 MG tablet Take 1 tablet (10 mg total) by mouth every 6 (six) hours as needed. 12/24/17   Enid DerryWagner, Ashley, PA-C  lidocaine (LIDODERM) 5 % Place 1 patch onto the skin every 12 (twelve) hours. Remove & Discard patch within 12 hours or as directed by MD 11/17/17 11/17/18  Enid DerryWagner, Ashley, PA-C  loperamide (IMODIUM A-D) 2 MG tablet Take 1 tablet (2 mg total) by mouth 4 (four) times daily as needed for  diarrhea or loose stools. 11/23/17   Minna AntisPaduchowski, Kevin, MD  magic mouthwash w/lidocaine SOLN Take 5 mLs by mouth 4 (four) times daily. Swish and swallow 06/21/17   Joni ReiningSmith, Ronald K, PA-C  methocarbamol (ROBAXIN) 500 MG tablet Take 1 tablet (500 mg total) by mouth every 8 (eight) hours as needed for up to 5 days. 07/05/18 07/10/18  Orvil FeilWoods, Jaclyn M, PA-C  ondansetron (ZOFRAN ODT) 4 MG disintegrating tablet Take 1 tablet (4 mg total) by mouth every 8 (eight) hours as needed for nausea or vomiting. 11/23/17   Minna AntisPaduchowski, Kevin, MD  predniSONE (STERAPRED UNI-PAK 21 TAB) 10 MG (21) TBPK tablet Take 6 tabs the the 1st day. Take 6 tabs the the 2nd day. Take 5 tabs the the 3rd day. Take 5 tabs the 4th day. Take 4 tabs the the 5th day.Take 4 tabs the the 6th day.Take 3 tabs the 7th day.Take 3 tabs the 8th day. Take 2 tabs the 9th day. Take 2 tabs the 10th day. Take 1 tab the 11th day. Take 1 tab the 12th day. 07/05/18   Orvil FeilWoods, Jaclyn M, PA-C  traMADol (ULTRAM) 50 MG tablet Take 1 tablet (50 mg total) by mouth every 12 (twelve) hours as needed. 07/08/18   Joni ReiningSmith, Ronald K, PA-C    Allergies Bactrim [sulfamethoxazole-trimethoprim]  No family history on file.  Social History Social History   Tobacco Use  . Smoking status: Never Smoker  . Smokeless tobacco: Never Used  Substance Use Topics  .  Alcohol use: Yes  . Drug use: No    Review of Systems Constitutional: No fever/chills Eyes: No visual changes. ENT: No sore throat. Cardiovascular: Denies chest pain. Respiratory: Denies shortness of breath. Gastrointestinal: No abdominal pain.  No nausea, no vomiting.  No diarrhea.  No constipation. Genitourinary: Negative for dysuria. Musculoskeletal: Neck pain left shoulder pain. Skin: Negative for rash. Neurological: Negative for headaches, focal weakness.. Intermittent numbness to the left upper extremity. Allergic/Immunilogical: Bactrim  ____________________________________________   PHYSICAL  EXAM:  VITAL SIGNS: ED Triage Vitals  Enc Vitals Group     BP 07/08/18 0131 128/83     Pulse Rate 07/08/18 0131 94     Resp 07/08/18 0131 18     Temp 07/08/18 0131 98.3 F (36.8 C)     Temp Source 07/08/18 0131 Oral     SpO2 07/08/18 0131 97 %     Weight 07/08/18 0132 300 lb (136.1 kg)     Height 07/08/18 0132 6\' 1"  (1.854 m)     Head Circumference --      Peak Flow --      Pain Score 07/08/18 0131 9     Pain Loc --      Pain Edu? --      Excl. in GC? --     Constitutional: Alert and oriented. Well appearing and in no acute distress. Neck: No stridor. No cervical spine tenderness to palpation. Hematological/Lymphatic/Immunilogical: No cervical lymphadenopathy. Cardiovascular: Normal rate, regular rhythm. Grossly normal heart sounds.  Good peripheral circulation. Respiratory: Normal respiratory effort.  No retractions. Lungs CTAB. Musculoskeletal: No obvious deformity to the left upper extremity.  Patient is full and equal range of motion. Neurologic:  Normal speech and language. No gross focal neurologic deficits are appreciated. No gait instability. Skin:  Skin is warm, dry and intact. No rash noted. Psychiatric: Mood and affect are normal. Speech and behavior are normal.  ____________________________________________   LABS (all labs ordered are listed, but only abnormal results are displayed)  Labs Reviewed - No data to display ____________________________________________  EKG   ____________________________________________  RADIOLOGY  ED MD interpretation:    Official radiology report(s): No results found.  ____________________________________________   PROCEDURES  Procedure(s) performed:   Procedures  Critical Care performed: No  ____________________________________________   INITIAL IMPRESSION / ASSESSMENT AND PLAN / ED COURSE  As part of my medical decision making, I reviewed the following data within the electronic MEDICAL RECORD NUMBER     Cervical radiculopathy to the left upper extremity.  Advised patient to continue previous medication and follow-up with neurology as directed.      ____________________________________________   FINAL CLINICAL IMPRESSION(S) / ED DIAGNOSES  Final diagnoses:  Cervical radiculitis     ED Discharge Orders         Ordered    traMADol (ULTRAM) 50 MG tablet  Every 12 hours PRN     07/08/18 0740           Note:  This document was prepared using Dragon voice recognition software and may include unintentional dictation errors.    Joni ReiningSmith, Ronald K, PA-C 07/08/18 [id number]    Minna AntisPaduchowski, Kevin, MD 07/08/18 [phone]

## 2018-07-08 NOTE — ED Notes (Signed)
See triage note  Presents with pain to neck and arm  Was seen on Friday for same  Placed on steroids and Robaxin  States sxs' started last weds  And has gotten progressively worse

## 2018-07-15 ENCOUNTER — Other Ambulatory Visit: Payer: Self-pay | Admitting: Student

## 2018-07-15 DIAGNOSIS — M5412 Radiculopathy, cervical region: Secondary | ICD-10-CM

## 2018-07-16 ENCOUNTER — Encounter: Payer: Self-pay | Admitting: Emergency Medicine

## 2018-07-16 ENCOUNTER — Other Ambulatory Visit: Payer: Self-pay

## 2018-07-16 ENCOUNTER — Emergency Department: Payer: 59

## 2018-07-16 ENCOUNTER — Emergency Department
Admission: EM | Admit: 2018-07-16 | Discharge: 2018-07-16 | Disposition: A | Discharge status: Home / Self Care | Payer: 59 | Attending: Student in an Organized Health Care Education/Training Program | Admitting: Student in an Organized Health Care Education/Training Program

## 2018-07-16 DIAGNOSIS — M5412 Radiculopathy, cervical region: Secondary | ICD-10-CM

## 2018-07-16 DIAGNOSIS — M542 Cervicalgia: Secondary | ICD-10-CM | POA: Diagnosis present

## 2018-07-16 MED ORDER — OXYCODONE-ACETAMINOPHEN 5-325 MG PO TABS
2.0000 | ORAL_TABLET | Freq: Once | ORAL | Status: AC
  Administered 2018-07-16: 2 via ORAL
  Filled 2018-07-16: qty 2

## 2018-07-16 MED ORDER — OXYCODONE-ACETAMINOPHEN 5-325 MG PO TABS
1.0000 | ORAL_TABLET | Freq: Once | ORAL | Status: AC
  Administered 2018-07-16: 1 via ORAL
  Filled 2018-07-16: qty 1

## 2018-07-16 NOTE — ED Notes (Signed)
ED Provider at bedside. 

## 2018-07-16 NOTE — ED Notes (Signed)
Pt is A/Ox4. NAD noted at this time.

## 2018-07-16 NOTE — ED Provider Notes (Signed)
Patient received in sign-out from Dr. York Cerise.  Workup and evaluation pending MRI.  MRI findings do show evidence of radiculopathy.  Discussed case with Dr. Adriana Simas of neurosurgery who agrees with outpatient management and continued care plan with close outpatient follow-up.  Patient stable and appropriate for outpatient follow-up      Willy Eddy, MD 07/16/18 [phone]

## 2018-07-16 NOTE — ED Triage Notes (Addendum)
Patient ambulatory to triage with steady gait, without difficulty or distress noted; pt st here last Friday for "pinched nerve", f/u with [facility]; this morning having "burning" to base of neck causing HA and "spasms" to arms; currently taking prednisone, tramadol and tizanidine

## 2018-07-16 NOTE — ED Notes (Signed)
Patient transported to MRI 

## 2018-07-16 NOTE — Consult Note (Signed)
Neurosurgery-New Consultation Evaluation 07/16/2018 [place] Laino [id number]  Identifying Statement: Eleasar Kiel is a 33 y.o. male from [place] [address] with neck pain and left arm numbness  Physician Requesting Consultation: [place] [facility]  History of Present Illness: Mr. Bloom is here for evaluation after having been seen in clinic recently for what appears to be a left upper extremity radiculopathy.  He states it did start about 2 weeks ago with a kink in his neck.  He has been having severe pain in his neck and the pain will go down the left arm.  He was having numbness in his arm but this is mostly centered now in the lateral 2 fingers of the left hand.  He denies any right arm symptoms.  He denies any leg symptoms.  He is completing a steroid taper and has 3 days left.  He has been taking a muscle relaxer and tramadol.  Today he woke up with more burning sensation in his neck which prompted the ED visit.  Past Medical History:  History reviewed. No pertinent past medical history.  Social History: Social History   Socioeconomic History  . Marital status: Married    Spouse name: Not on file  . Number of children: Not on file  . Years of education: Not on file  . Highest education level: Not on file  Occupational History  . Not on file  Social Needs  . Financial resource strain: Not on file  . Food insecurity:    Worry: Not on file    Inability: Not on file  . Transportation needs:    Medical: Not on file    Non-medical: Not on file  Tobacco Use  . Smoking status: Never Smoker  . Smokeless tobacco: Never Used  Substance and Sexual Activity  . Alcohol use: Yes  . Drug use: No  . Sexual activity: Not on file  Lifestyle  . Physical activity:    Days per week: Not on file    Minutes per session: Not on file  . Stress: Not on file  Relationships  . Social connections:    Talks on phone: Not on file    Gets together: Not on file     Attends religious service: Not on file    Active member of club or organization: Not on file    Attends meetings of clubs or organizations: Not on file    Relationship status: Not on file  . Intimate partner violence:    Fear of current or ex partner: Not on file    Emotionally abused: Not on file    Physically abused: Not on file    Forced sexual activity: Not on file  Other Topics Concern  . Not on file  Social History Narrative  . Not on file    Family History: No family history on file.  Review of Systems:  Review of Systems - General ROS: Negative Psychological ROS: Negative Ophthalmic ROS: Negative ENT ROS: Negative Hematological and Lymphatic ROS: Negative  Endocrine ROS: Negative Respiratory ROS: Negative Cardiovascular ROS: Negative Gastrointestinal ROS: Negative Genito-Urinary ROS: Negative Musculoskeletal ROS: Positive for neck pain Neurological ROS: Positive for left arm numbness, pain Dermatological ROS: Negative  Physical Exam: BP (!) 136/98 (BP Location: Right Arm)   Pulse 86   Temp 98 F (36.7 C) (Oral)   Resp 19   Ht 6\' 1"  (1.854 m)   Wt 132.9 kg   SpO2 96%   BMI 38.66 kg/m  Body mass  index is 38.66 kg/m. Body surface area is 2.62 meters squared. General appearance: Alert, cooperative, in no acute distress Head: Normocephalic, atraumatic Eyes: Normal, EOM intact Oropharynx: Moist without lesions Neck: Supple, motion appears full Ext: No edema in upper or lower extremities  Neurologic exam:  Mental status: alertness: alert, affect: normal Speech: fluent and clear Motor:strength symmetric 5/5 in bilateral deltoid, bicep, tricep, wrist extension, interossei, 5 out of 5 in lower extremities Sensory: Decreased to light touch in the lateral hand, medial lateral forearm, lateral shoulder Gait: Not tested but is ambulatory   Imaging: MRI cervical spine: There is some loss of lordotic curvature.  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  There is a small disc osteophyte complex at C5 5 6 eccentric to the left which does result in some neuroforaminal stenosis.  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  This does cause some mild central canal stenosis.  Impression/Plan:  Mr. Garner NashDaniels is here for reevaluation after having been seen in clinic for what appears to be mostly left C6 and C7 radiculopathy.  Today he was complaining of more burning and pain in his neck.  I reviewed the MRI and it does show disc herniation at C6-7 which is the likely etiology for his symptoms.  We discussed that he has plans for more conservative therapy of steroid injections, completion of steroid taper and other medications this week.  I will reach out to our clinic to see if we can schedule him to discuss if these treatments do not improve his symptoms, possible surgical intervention.  We did discuss that he may need surgery given the size of the disc fragment, however he wants to avoid surgery if possible.  I do agree that there are no emergent findings at this time suggest the need for more urgent intervention.   1.  Diagnosis: Left C7 radiculopathy  2.  Plan  Continue with plan for conservative treatment of steroid injections and medications.  We will reach out for rescheduling him in clinic to discuss possible intervention in the future

## 2018-07-16 NOTE — ED Notes (Signed)
Pt sates he has a ride to take him home if it is necessary.

## 2018-07-16 NOTE — ED Provider Notes (Signed)
[facility] Emergency Department Provider Note  ____________________________________________   First MD Initiated Contact with Patient 07/16/18 [id number]     (approximate)  I have reviewed the triage vital signs and the nursing notes.   HISTORY  Chief Complaint Headache and Neck Pain    HPI Chris Hart is a 33 y.o. male who presents for evaluation of persistent pain at the base of his neck radiating down his left arm with associated numbness, tingling, and weakness.  The symptoms have been present for a few weeks.  He is been seen in the emergency department previously for this and started on steroids, Lidoderm patch, NSAIDs, etc.  He reports that nothing at all is helped.  He followed up with neurosurgery with the PA for Dr. Myer Haff and the plan is for outpatient MRI at the end of this week.  However he reports that nothing has made the pain better and he is not able to function.  He also says that he is now having weakness in the arm which was not present previously.  He was not able to sleep last night and given the severity of the pain he felt like he should come be evaluated.  He reports that the pain also radiates up into his head.  He denies fever/chills, chest pain, shortness of breath, nausea, vomiting, abdominal pain, and dysuria.  No symptoms in his right arm or legs.  No visual changes.  History reviewed. No pertinent past medical history.  There are no active problems to display for this patient.   History reviewed. No pertinent surgical history.  Prior to Admission medications   Medication Sig Start Date End Date Taking? Authorizing Provider  azithromycin (ZITHROMAX) 250 MG tablet 2 tablets today, then 1 tablet for the next 4 days. 06/28/17   Triplett, Rulon Eisenmenger B, FNP  clindamycin (CLEOCIN) 150 MG capsule Take 1 capsule (150 mg total) by mouth 4 (four) times daily. 06/21/17   Joni Reining, PA-C  cyclobenzaprine (FLEXERIL) 5 MG tablet Take 1-2  tablets 3 times daily as needed 11/17/17   Enid Derry, PA-C  guaiFENesin-codeine 100-10 MG/5ML syrup Take 10 mLs by mouth 3 (three) times daily as needed. 06/28/17   Triplett, Rulon Eisenmenger B, FNP  ketorolac (TORADOL) 10 MG tablet Take 1 tablet (10 mg total) by mouth every 6 (six) hours as needed. 12/24/17   Enid Derry, PA-C  lidocaine (LIDODERM) 5 % Place 1 patch onto the skin every 12 (twelve) hours. Remove & Discard patch within 12 hours or as directed by MD 11/17/17 11/17/18  Enid Derry, PA-C  loperamide (IMODIUM A-D) 2 MG tablet Take 1 tablet (2 mg total) by mouth 4 (four) times daily as needed for diarrhea or loose stools. 11/23/17   Minna Antis, MD  magic mouthwash w/lidocaine SOLN Take 5 mLs by mouth 4 (four) times daily. Swish and swallow 06/21/17   Joni Reining, PA-C  ondansetron (ZOFRAN ODT) 4 MG disintegrating tablet Take 1 tablet (4 mg total) by mouth every 8 (eight) hours as needed for nausea or vomiting. 11/23/17   Minna Antis, MD  predniSONE (STERAPRED UNI-PAK 21 TAB) 10 MG (21) TBPK tablet Take 6 tabs the the 1st day. Take 6 tabs the the 2nd day. Take 5 tabs the the 3rd day. Take 5 tabs the 4th day. Take 4 tabs the the 5th day.Take 4 tabs the the 6th day.Take 3 tabs the 7th day.Take 3 tabs the 8th day. Take 2 tabs the 9th day. Take 2  tabs the 10th day. Take 1 tab the 11th day. Take 1 tab the 12th day. 07/05/18   Orvil Feil, PA-C  traMADol (ULTRAM) 50 MG tablet Take 1 tablet (50 mg total) by mouth every 12 (twelve) hours as needed. 07/08/18   Joni Reining, PA-C    Allergies Bactrim [sulfamethoxazole-trimethoprim]  No family history on file.  Social History Social History   Tobacco Use  . Smoking status: Never Smoker  . Smokeless tobacco: Never Used  Substance Use Topics  . Alcohol use: Yes  . Drug use: No    Review of Systems Constitutional: No fever/chills Eyes: No visual changes. ENT: No sore throat. Cardiovascular: Denies chest pain. Respiratory:  Denies shortness of breath. Gastrointestinal: No abdominal pain.  No nausea, no vomiting.  No diarrhea.  No constipation. Genitourinary: Negative for dysuria. Musculoskeletal: neck pain as described above.  Negative for back pain. Integumentary: Negative for rash. Neurological: Pain radiating from the neck down into the left arm with associated weakness, numbness, and tingling.   ____________________________________________   PHYSICAL EXAM:  VITAL SIGNS: ED Triage Vitals  Enc Vitals Group     BP 07/16/18 0532 (!) 131/93     Pulse Rate 07/16/18 0532 90     Resp 07/16/18 0532 18     Temp 07/16/18 0532 98 F (36.7 C)     Temp Source 07/16/18 0532 Oral     SpO2 07/16/18 0532 98 %     Weight 07/16/18 0531 132.9 kg (293 lb)     Height 07/16/18 0531 1.854 m (6\' 1" )     Head Circumference --      Peak Flow --      Pain Score 07/16/18 0531 9     Pain Loc --      Pain Edu? --      Excl. in GC? --     Constitutional: Alert and oriented. Well appearing and in no acute distress. Eyes: Conjunctivae are normal.  Head: Atraumatic. Mouth/Throat: Mucous membranes are moist. Neck: No stridor.  No meningeal signs.   Cardiovascular: Normal rate, regular rhythm. Good peripheral circulation. Grossly normal heart sounds. Respiratory: Normal respiratory effort.  No retractions. Lungs CTAB. Gastrointestinal: Soft and nontender. No distention.  Musculoskeletal: Tenderness to palpation at the base of his cervical spine but top of his thoracic spine as well.  No fluctuance nor induration. Neurologic:  Normal speech and language.  The patient has decreased grip strength and decrease flexion and extension of his elbow and his left arm as well as decreased ability to raise his arm against resistance.  He reports subjective numbness in the left upper extremity. Skin:  Skin is warm, dry and intact. No rash noted. Psychiatric: Mood and affect are normal. Speech and behavior are  normal.  ____________________________________________   LABS (all labs ordered are listed, but only abnormal results are displayed)  Labs Reviewed - No data to display ____________________________________________  EKG  No indication for EKG ____________________________________________  RADIOLOGY   ED MD interpretation:  MRIs of C- and T-spines are pending.  Official radiology report(s): No results found.  ____________________________________________   PROCEDURES  Critical Care performed: No   Procedure(s) performed:   Procedures   ____________________________________________   INITIAL IMPRESSION / ASSESSMENT AND PLAN / ED COURSE  As part of my medical decision making, I reviewed the following data within the electronic MEDICAL RECORD NUMBER Nursing notes reviewed and incorporated, Old chart reviewed, Patient signed out to Dr. Roxan Hockey, Notes from prior ED visits and  [place] Controlled Substance Database    Differential diagnosis includes, but is not limited to, cervical radiculopathy, acute impingement of his spinal cord secondary to stenosis or bulging disc, less likely other neuropathies such as central cord syndrome.  No indication of epidural abscess nor osteomyelitis.  The patient has had symptoms for several weeks but given the fact that he is now reporting weakness in the left upper extremity which was not present previously and his symptoms do seem to be getting worse, I do think it is appropriate to proceed with imaging of the cervical and thoracic spine given that the location of the issue seems to be at the very base of the cervical spine and top of the thoracic.  The patient made a comment about having been approved for the MRI by his insurance and I explained that this is a different situation in the emergent setting and that I cannot guarantee coverage but it is what I have available and he states that he understands and agrees with the plan.  No indication for lab  work.  I have discussed the case with Dr. Roxan Hockeyobinson who will follow-up on the imaging and disposition appropriately.  I have given 2 Percocet by mouth to help him in the meantime but I do not think it would be necessary or appropriate to discharge him on narcotics; he has already seen the neurosurgeon and has received a prescription for tramadol.     ____________________________________________  FINAL CLINICAL IMPRESSION(S) / ED DIAGNOSES  Final diagnoses:  Cervical radiculopathy     MEDICATIONS GIVEN DURING THIS VISIT:  Medications  oxyCODONE-acetaminophen (PERCOCET/ROXICET) 5-325 MG per tablet 2 tablet (2 tablets Oral Given 07/16/18 [id number])     ED Discharge Orders    None       Note:  This document was prepared using Dragon voice recognition software and may include unintentional dictation errors.    Loleta RoseForbach, Peggye Poon, MD 07/16/18 [phone]

## 2018-07-16 NOTE — ED Notes (Addendum)
Pt sates he has tramadol Rx at home for pain. Pt states Rx was given to him 07/08/2018. Pt c/o of pain that starts at base of neck and goes down to this arm. Pt states his pain is a 8/10

## 2018-07-21 ENCOUNTER — Emergency Department
Admission: EM | Admit: 2018-07-21 | Discharge: 2018-07-21 | Disposition: A | Discharge status: Home / Self Care | Payer: 59 | Attending: Student in an Organized Health Care Education/Training Program | Admitting: Student in an Organized Health Care Education/Training Program

## 2018-07-21 ENCOUNTER — Other Ambulatory Visit: Payer: Self-pay

## 2018-07-21 ENCOUNTER — Encounter: Payer: Self-pay | Admitting: Emergency Medicine

## 2018-07-21 DIAGNOSIS — M5412 Radiculopathy, cervical region: Secondary | ICD-10-CM | POA: Insufficient documentation

## 2018-07-21 DIAGNOSIS — J111 Influenza due to unidentified influenza virus with other respiratory manifestations: Secondary | ICD-10-CM | POA: Diagnosis not present

## 2018-07-21 DIAGNOSIS — R05 Cough: Secondary | ICD-10-CM | POA: Diagnosis present

## 2018-07-21 MED ORDER — HYDROCOD POLST-CPM POLST ER 10-8 MG/5ML PO SUER
5.0000 mL | Freq: Once | ORAL | Status: AC
  Administered 2018-07-21: 5 mL via ORAL
  Filled 2018-07-21: qty 5

## 2018-07-21 MED ORDER — IBUPROFEN 600 MG PO TABS
600.0000 mg | ORAL_TABLET | Freq: Once | ORAL | Status: AC
  Administered 2018-07-21: 600 mg via ORAL
  Filled 2018-07-21: qty 1

## 2018-07-21 MED ORDER — PROMETHAZINE-CODEINE 6.25-10 MG/5ML PO SYRP: 5.0000 mL | ORAL_SOLUTION | Freq: Four times a day (QID) | ORAL | 0 refills | Status: DC | PRN

## 2018-07-21 NOTE — ED Notes (Signed)
See triage note  Presents with low grade fever,cough,sore throat   Also states he is having some discomfort in chest with cough  States prod at times

## 2018-07-21 NOTE — Discharge Instructions (Signed)
Take tylenol or ibuprofen for fever and body aches.  Rest and stay well hydrated.  Do not return to work until fever free for 24 hours.

## 2018-07-21 NOTE — ED Triage Notes (Addendum)
Pt has had green productive cough since yesterday per pt. Thinks he had fever last night and took tylenol but did not check with thermometer.  Denies congestion/runny nose.  Pain to chest wall with coughing.  Unlabored.  Pt also has pinched nerve in neck and had MRI last Monday for it.  Sees [facility] for this and reports the pain has not gotten better and would like to be seen for the pain.  Informed patient if seeing [facility] for this already and has next FU and saw last week probably will not be able to fix in ED today but will have provider look at.  Mask on.

## 2018-07-21 NOTE — ED Provider Notes (Signed)
[facility] Emergency Department Provider Note  ____________________________________________  Time seen: Approximately 9:31 AM  I have reviewed the triage vital signs and the nursing notes.   HISTORY  Chief Complaint Cough   HPI Chris Hart is a 33 y.o. male who presents to the emergency department for treatment and evaluation of cough, fever, chills, and sore throat. Symptoms started last night. He took tylenol without much relief. He is also complaining of continued pain at the base of his neck that persistently radiates into his left arm. No relief with ESI, prednisone taper, muscle relaxers, or Tramadol. Symptoms are no worse than his last visit here 5 days ago.   History reviewed. No pertinent past medical history.  There are no active problems to display for this patient.   History reviewed. No pertinent surgical history.  Prior to Admission medications   Medication Sig Start Date End Date Taking? Authorizing Provider  azithromycin (ZITHROMAX) 250 MG tablet 2 tablets today, then 1 tablet for the next 4 days. 06/28/17   Umar Patmon, Rulon Eisenmenger B, FNP  clindamycin (CLEOCIN) 150 MG capsule Take 1 capsule (150 mg total) by mouth 4 (four) times daily. 06/21/17   Joni Reining, PA-C  cyclobenzaprine (FLEXERIL) 5 MG tablet Take 1-2 tablets 3 times daily as needed 11/17/17   Enid Derry, PA-C  ketorolac (TORADOL) 10 MG tablet Take 1 tablet (10 mg total) by mouth every 6 (six) hours as needed. 12/24/17   Enid Derry, PA-C  lidocaine (LIDODERM) 5 % Place 1 patch onto the skin every 12 (twelve) hours. Remove & Discard patch within 12 hours or as directed by MD 11/17/17 11/17/18  Enid Derry, PA-C  loperamide (IMODIUM A-D) 2 MG tablet Take 1 tablet (2 mg total) by mouth 4 (four) times daily as needed for diarrhea or loose stools. 11/23/17   Minna Antis, MD  magic mouthwash w/lidocaine SOLN Take 5 mLs by mouth 4 (four) times daily. Swish and swallow 06/21/17    Joni Reining, PA-C  ondansetron (ZOFRAN ODT) 4 MG disintegrating tablet Take 1 tablet (4 mg total) by mouth every 8 (eight) hours as needed for nausea or vomiting. 11/23/17   Minna Antis, MD  predniSONE (STERAPRED UNI-PAK 21 TAB) 10 MG (21) TBPK tablet Take 6 tabs the the 1st day. Take 6 tabs the the 2nd day. Take 5 tabs the the 3rd day. Take 5 tabs the 4th day. Take 4 tabs the the 5th day.Take 4 tabs the the 6th day.Take 3 tabs the 7th day.Take 3 tabs the 8th day. Take 2 tabs the 9th day. Take 2 tabs the 10th day. Take 1 tab the 11th day. Take 1 tab the 12th day. 07/05/18   Orvil Feil, PA-C  promethazine-codeine (PHENERGAN WITH CODEINE) 6.25-10 MG/5ML syrup Take 5 mLs by mouth every 6 (six) hours as needed for cough. 07/21/18   Pa Tennant, Rulon Eisenmenger B, FNP  traMADol (ULTRAM) 50 MG tablet Take 1 tablet (50 mg total) by mouth every 12 (twelve) hours as needed. 07/08/18   Joni Reining, PA-C    Allergies Bactrim [sulfamethoxazole-trimethoprim]  History reviewed. No pertinent family history.  Social History Social History   Tobacco Use  . Smoking status: Never Smoker  . Smokeless tobacco: Never Used  Substance Use Topics  . Alcohol use: Yes  . Drug use: No    Review of Systems Constitutional: Positive for fever/chills. Decreased appetite. ENT: Positive for sore throat. Cardiovascular: Denies chest pain. Respiratory: Negative for shortness of breath. Positive for  cough. Negative for  wheezing.  Gastrointestinal: No nausea, no vomiting.  no diarrhea.  Musculoskeletal: Positive for body aches Skin: Negative for rash. Neurological: Positive for headaches ____________________________________________   PHYSICAL EXAM:  VITAL SIGNS: ED Triage Vitals  Enc Vitals Group     BP 07/21/18 0907 128/86     Pulse Rate 07/21/18 0907 (!) 112     Resp 07/21/18 0907 18     Temp 07/21/18 0907 (!) 100.8 F (38.2 C)     Temp Source 07/21/18 0907 Oral     SpO2 07/21/18 0907 98 %     Weight  07/21/18 0902 290 lb (131.5 kg)     Height 07/21/18 0902 6\' 1"  (1.854 m)     Head Circumference --      Peak Flow --      Pain Score 07/21/18 0902 8     Pain Loc --      Pain Edu? --      Excl. in GC? --     Constitutional: Alert and oriented. Acutely ill appearing and in no acute distress. Eyes: Conjunctivae are normal. Ears: TM injected and erythematous Nose: No sinus congestion noted; no rhinnorhea. Mouth/Throat: Mucous membranes are moist.  Oropharynx erythematous. Tonsils without exudate. Uvula midline. Neck: No stridor.  Lymphatic: No cervical lymphadenopathy. Cardiovascular: Normal rate, regular rhythm. Good peripheral circulation. Respiratory: Respirations are even and unlabored.  No retractions. Breath sounds clear. Gastrointestinal: Soft and nontender.  Musculoskeletal: FROM x 4 extremities. Neurologic:  Normal speech and language. Skin:  Skin is warm, dry and intact. No rash noted. Psychiatric: Mood and affect are normal. Speech and behavior are normal.  ____________________________________________   LABS (all labs ordered are listed, but only abnormal results are displayed)  Labs Reviewed - No data to display ____________________________________________  EKG  Not indicated ____________________________________________  RADIOLOGY  Not indicated ____________________________________________   PROCEDURES  Procedure(s) performed: None  Critical Care performed: No ____________________________________________   INITIAL IMPRESSION / ASSESSMENT AND PLAN / ED COURSE  33 y.o. male who presents to the ER for flu like symptoms. Exam is consistent with the same. He will be treated with phenergan with codeine which may also help with the neck/arm pain. MRI report from 5 days ago reviewed as well as the last note from neurosurgery office. He is to keep his scheduled follow up on Feb 4 unless he is able to be seen sooner. A work note was provided and he is advised to  return once he has been fever free for 24 hours.   Medications  chlorpheniramine-HYDROcodone (TUSSIONEX) 10-8 MG/5ML suspension 5 mL (5 mLs Oral Given 07/21/18 0942)  ibuprofen (ADVIL,MOTRIN) tablet 600 mg (600 mg Oral Given 07/21/18 6440)    ED Discharge Orders         Ordered    promethazine-codeine (PHENERGAN WITH CODEINE) 6.25-10 MG/5ML syrup  Every 6 hours PRN     07/21/18 0935           Pertinent labs & imaging results that were available during my care of the patient were reviewed by me and considered in my medical decision making (see chart for details).    If controlled substance prescribed during this visit, 12 month history viewed on the [facility] prior to issuing an initial prescription for Schedule II or III opiod. ____________________________________________   FINAL CLINICAL IMPRESSION(S) / ED DIAGNOSES  Final diagnoses:  Influenza  Cervical radiculopathy    Note:  This document was prepared using Dragon voice recognition software and may  include unintentional dictation errors.     Chinita Pesterriplett, Daina Cara B, FNP 07/21/18 [id number]    Willy Eddyobinson, Patrick, MD 07/21/18 1028

## 2018-07-26 ENCOUNTER — Ambulatory Visit: Payer: 59

## 2018-08-06 ENCOUNTER — Ambulatory Visit
Admission: RE | Admit: 2018-08-06 | Discharge: 2018-08-06 | Disposition: A | Discharge status: Home / Self Care | Payer: 59 | Source: Ambulatory Visit | Attending: Neurosurgery | Admitting: Neurosurgery

## 2018-08-06 ENCOUNTER — Ambulatory Visit: Admission: RE | Admit: 2018-08-06 | Payer: 59 | Source: Ambulatory Visit

## 2018-08-06 ENCOUNTER — Encounter
Admission: RE | Admit: 2018-08-06 | Discharge: 2018-08-06 | Disposition: A | Discharge status: Home / Self Care | Payer: 59 | Source: Ambulatory Visit | Attending: Neurosurgery | Admitting: Neurosurgery

## 2018-08-06 ENCOUNTER — Other Ambulatory Visit: Payer: Self-pay

## 2018-08-06 DIAGNOSIS — Z419 Encounter for procedure for purposes other than remedying health state, unspecified: Secondary | ICD-10-CM

## 2018-08-06 HISTORY — DX: Epilepsy, unspecified, not intractable, without status epilepticus: G40.909

## 2018-08-06 LAB — CBC
HCT: 41.1 % (ref 39.0–52.0)
Hemoglobin: 14.1 g/dL (ref 13.0–17.0)
MCH: 30.4 pg (ref 26.0–34.0)
MCHC: 34.3 g/dL (ref 30.0–36.0)
MCV: 88.6 fL (ref 80.0–100.0)
Platelets: 333 10*3/uL (ref 150–400)
RBC: 4.64 MIL/uL (ref 4.22–5.81)
RDW: 12.3 % (ref 11.5–15.5)
WBC: 9.1 10*3/uL (ref 4.0–10.5)
nRBC: 0 % (ref 0.0–0.2)

## 2018-08-06 LAB — APTT: aPTT: 31 seconds (ref 24–36)

## 2018-08-06 LAB — URINALYSIS, ROUTINE W REFLEX MICROSCOPIC
Bilirubin Urine: NEGATIVE
Glucose, UA: NEGATIVE mg/dL
Hgb urine dipstick: NEGATIVE
Ketones, ur: NEGATIVE mg/dL
Leukocytes,Ua: NEGATIVE
Nitrite: NEGATIVE
Protein, ur: NEGATIVE mg/dL
Specific Gravity, Urine: 1.03 (ref 1.005–1.030)
pH: 5 (ref 5.0–8.0)

## 2018-08-06 LAB — TYPE AND SCREEN
ABO/RH(D): A POS
Antibody Screen: NEGATIVE

## 2018-08-06 LAB — BASIC METABOLIC PANEL
ANION GAP: 8 (ref 5–15)
BUN: 11 mg/dL (ref 6–20)
CO2: 26 mmol/L (ref 22–32)
Calcium: 9.5 mg/dL (ref 8.9–10.3)
Chloride: 103 mmol/L (ref 98–111)
Creatinine, Ser: 0.82 mg/dL (ref 0.61–1.24)
GFR calc Af Amer: >60 mL/min (ref >60)
GFR calc non Af Amer: >60 mL/min (ref >60)
Glucose, Bld: 92 mg/dL (ref 70–99)
Potassium: 3.8 mmol/L (ref 3.5–5.1)
Sodium: 137 mmol/L (ref 135–145)

## 2018-08-06 LAB — PROTIME-INR
INR: 0.89
Prothrombin Time: 12 seconds (ref 11.4–15.2)

## 2018-08-06 LAB — SURGICAL PCR SCREEN
MRSA, PCR: NEGATIVE
Staphylococcus aureus: POSITIVE — AB

## 2018-08-06 NOTE — Patient Instructions (Signed)
Your procedure is scheduled on: Mon 2/24 Report to Day Surgery. To find out your arrival time please call [phone] between 1PM - 3PM on Fri. 2/21  Remember: Instructions that are not followed completely may result in serious medical risk,  up to and including death, or upon the discretion of your surgeon and anesthesiologist your  surgery may need to be rescheduled.     _X__ 1. Do not eat food after midnight the night before your procedure.                 No gum chewing or hard candies. You may drink clear liquids up to 2 hours                 before you are scheduled to arrive for your surgery- DO not drink clear                 liquids within 2 hours of the start of your surgery.                 Clear Liquids include:  water, apple juice without pulp, clear carbohydrate                 drink such as Clearfast of Gatorade, Black Coffee or Tea (Do not add                 anything to coffee or tea).  __X__2.  On the morning of surgery brush your teeth with toothpaste and water, you                may rinse your mouth with mouthwash if you wish.  Do not swallow any toothpaste of mouthwash.     _X__ 3.  No Alcohol for 24 hours before or after surgery.   ___ 4.  Do Not Smoke or use e-cigarettes For 24 Hours Prior to Your Surgery.                 Do not use any chewable tobacco products for at least 6 hours prior to                 surgery.  ____  5.  Bring all medications with you on the day of surgery if instructed.   __x__  6.  Notify your doctor if there is any change in your medical condition      (cold, fever, infections).     Do not wear jewelry, make-up, hairpins, clips or nail polish. Do not wear lotions, powders, or perfumes. You may wear deodorant. Do not shave 48 hours prior to surgery. Men may shave face and neck. Do not bring valuables to the hospital.    [facility] any belongings or valuables.  Contacts, dentures or  bridgework may not be worn into surgery. Leave your suitcase in the car. After surgery it may be brought to your room. For patients admitted to the hospital, discharge time is determined by your treatment team.   Patients discharged the day of surgery will not be allowed to drive home.   Please read over the following fact sheets that you were given:    _x___ Take these medicines the morning of surgery with A SIP OF WATER:    1. tiZANidine (ZANAFLEX) 2 MG tablet if needed  2.traMADol (ULTRAM) 50 MG tablet if needed  3.   4.  5.  6.  ____ Fleet Enema (as directed)   __x__ Use CHG  Soap as directed  ____ Use inhalers on the day of surgery  ____ Stop metformin 2 days prior to surgery    ____ Take 1/2 of usual insulin dose the night before surgery. No insulin the morning          of surgery.   ____ Stop Coumadin/Plavix/aspirin on   __x__ Stop Anti-inflammatories Aleve or ibuprofen .  May take tylenol   ____ Stop supplements until after surgery.    ____ Bring C-Pap to the hospital.

## 2018-08-07 NOTE — Pre-Procedure Instructions (Signed)
Positive staph aureus results sent to Dr. Adriana Simas for review.  Asked if wanted any other antibiotics given?

## 2018-08-19 ENCOUNTER — Other Ambulatory Visit: Payer: Self-pay

## 2018-08-19 ENCOUNTER — Encounter
Admission: RE | Disposition: A | Discharge status: Home / Self Care | Payer: Self-pay | Source: Ambulatory Visit | Attending: Neurosurgery

## 2018-08-19 ENCOUNTER — Ambulatory Visit: Payer: 59 | Admitting: Certified Registered"

## 2018-08-19 ENCOUNTER — Observation Stay
Admission: RE | Admit: 2018-08-19 | Discharge: 2018-08-20 | Disposition: A | Discharge status: Home / Self Care | Payer: 59 | Source: Ambulatory Visit | Attending: Neurosurgery | Admitting: Neurosurgery

## 2018-08-19 ENCOUNTER — Ambulatory Visit: Payer: 59

## 2018-08-19 ENCOUNTER — Encounter: Payer: Self-pay | Admitting: *Deleted

## 2018-08-19 DIAGNOSIS — M2578 Osteophyte, vertebrae: Secondary | ICD-10-CM | POA: Diagnosis not present

## 2018-08-19 DIAGNOSIS — M4802 Spinal stenosis, cervical region: Secondary | ICD-10-CM | POA: Diagnosis not present

## 2018-08-19 DIAGNOSIS — Z419 Encounter for procedure for purposes other than remedying health state, unspecified: Secondary | ICD-10-CM

## 2018-08-19 DIAGNOSIS — M79602 Pain in left arm: Secondary | ICD-10-CM | POA: Diagnosis present

## 2018-08-19 DIAGNOSIS — M50122 Cervical disc disorder at C5-C6 level with radiculopathy: Principal | ICD-10-CM | POA: Insufficient documentation

## 2018-08-19 DIAGNOSIS — Z981 Arthrodesis status: Secondary | ICD-10-CM

## 2018-08-19 HISTORY — PX: ANTERIOR CERVICAL DECOMP/DISCECTOMY FUSION: SHX1161

## 2018-08-19 LAB — TYPE AND SCREEN
ABO/RH(D): A POS
Antibody Screen: NEGATIVE

## 2018-08-19 SURGERY — ANTERIOR CERVICAL DECOMPRESSION/DISCECTOMY FUSION 2 LEVEL/HARDWARE REMOVAL
Anesthesia: General | Site: Neck

## 2018-08-19 MED ORDER — METHOCARBAMOL 500 MG PO TABS
500.0000 mg | ORAL_TABLET | Freq: Four times a day (QID) | ORAL | Status: DC
  Administered 2018-08-19 – 2018-08-20 (×2): 500 mg via ORAL
  Filled 2018-08-19 (×2): qty 1

## 2018-08-19 MED ORDER — HYDROMORPHONE HCL 1 MG/ML IJ SOLN: 0.5000 mg | INTRAMUSCULAR | Status: DC | PRN

## 2018-08-19 MED ORDER — ONDANSETRON HCL 4 MG PO TABS: 4.0000 mg | ORAL_TABLET | Freq: Four times a day (QID) | ORAL | Status: DC | PRN

## 2018-08-19 MED ORDER — ROCURONIUM BROMIDE 50 MG/5ML IV SOLN
INTRAVENOUS | Status: AC
  Filled 2018-08-19: qty 1

## 2018-08-19 MED ORDER — MIDAZOLAM HCL 2 MG/2ML IJ SOLN
INTRAMUSCULAR | Status: AC
  Filled 2018-08-19: qty 2

## 2018-08-19 MED ORDER — ACETAMINOPHEN 10 MG/ML IV SOLN
INTRAVENOUS | Status: DC | PRN
  Administered 2018-08-19: 1000 mg via INTRAVENOUS

## 2018-08-19 MED ORDER — GLYCOPYRROLATE 0.2 MG/ML IJ SOLN
INTRAMUSCULAR | Status: DC | PRN
  Administered 2018-08-19: 0.2 mg via INTRAVENOUS

## 2018-08-19 MED ORDER — LACTATED RINGERS IV SOLN
INTRAVENOUS | Status: DC | PRN
  Administered 2018-08-19: 15:00:00 via INTRAVENOUS

## 2018-08-19 MED ORDER — EPHEDRINE SULFATE 50 MG/ML IJ SOLN
INTRAMUSCULAR | Status: DC | PRN
  Administered 2018-08-19 (×2): 5 mg via INTRAVENOUS

## 2018-08-19 MED ORDER — PHENYLEPHRINE HCL 10 MG/ML IJ SOLN
INTRAMUSCULAR | Status: DC | PRN
  Administered 2018-08-19 (×9): 100 ug via INTRAVENOUS

## 2018-08-19 MED ORDER — DEXMEDETOMIDINE HCL IN NACL 200 MCG/50ML IV SOLN
INTRAVENOUS | Status: DC | PRN
  Administered 2018-08-19: 20 ug via INTRAVENOUS

## 2018-08-19 MED ORDER — MAGNESIUM CITRATE PO SOLN
1.0000 | Freq: Once | ORAL | Status: DC | PRN
  Filled 2018-08-19: qty 296

## 2018-08-19 MED ORDER — SUGAMMADEX SODIUM 500 MG/5ML IV SOLN
INTRAVENOUS | Status: AC
  Filled 2018-08-19: qty 5

## 2018-08-19 MED ORDER — FENTANYL CITRATE (PF) 100 MCG/2ML IJ SOLN
25.0000 ug | INTRAMUSCULAR | Status: DC | PRN
  Administered 2018-08-19: 25 ug via INTRAVENOUS
  Administered 2018-08-19: 50 ug via INTRAVENOUS
  Administered 2018-08-19: 25 ug via INTRAVENOUS

## 2018-08-19 MED ORDER — PROPOFOL 10 MG/ML IV BOLUS
INTRAVENOUS | Status: AC
  Filled 2018-08-19: qty 20

## 2018-08-19 MED ORDER — LIDOCAINE HCL (CARDIAC) PF 100 MG/5ML IV SOSY
PREFILLED_SYRINGE | INTRAVENOUS | Status: DC | PRN
  Administered 2018-08-19: 50 mg via INTRAVENOUS

## 2018-08-19 MED ORDER — PROMETHAZINE HCL 25 MG/ML IJ SOLN: 6.2500 mg | INTRAMUSCULAR | Status: DC | PRN

## 2018-08-19 MED ORDER — ONDANSETRON HCL 4 MG/2ML IJ SOLN: 4.0000 mg | Freq: Four times a day (QID) | INTRAMUSCULAR | Status: DC | PRN

## 2018-08-19 MED ORDER — ONDANSETRON HCL 4 MG/2ML IJ SOLN
INTRAMUSCULAR | Status: DC | PRN
  Administered 2018-08-19: 4 mg via INTRAVENOUS

## 2018-08-19 MED ORDER — SODIUM CHLORIDE 0.9 % IV SOLN
INTRAVENOUS | Status: DC
  Administered 2018-08-19: 21:00:00 via INTRAVENOUS

## 2018-08-19 MED ORDER — ACETAMINOPHEN 650 MG RE SUPP: 650.0000 mg | RECTAL | Status: DC | PRN

## 2018-08-19 MED ORDER — SUCCINYLCHOLINE CHLORIDE 20 MG/ML IJ SOLN
INTRAMUSCULAR | Status: DC | PRN
  Administered 2018-08-19: 100 mg via INTRAVENOUS

## 2018-08-19 MED ORDER — PROPOFOL 10 MG/ML IV BOLUS
INTRAVENOUS | Status: DC | PRN
  Administered 2018-08-19: 200 mg via INTRAVENOUS

## 2018-08-19 MED ORDER — ROCURONIUM BROMIDE 100 MG/10ML IV SOLN
INTRAVENOUS | Status: DC | PRN
  Administered 2018-08-19: 5 mg via INTRAVENOUS
  Administered 2018-08-19: 35 mg via INTRAVENOUS

## 2018-08-19 MED ORDER — ACETAMINOPHEN 500 MG PO TABS
1000.0000 mg | ORAL_TABLET | Freq: Four times a day (QID) | ORAL | Status: DC
  Administered 2018-08-19 – 2018-08-20 (×3): 1000 mg via ORAL
  Filled 2018-08-19 (×3): qty 2

## 2018-08-19 MED ORDER — ACETAMINOPHEN 10 MG/ML IV SOLN
INTRAVENOUS | Status: AC
  Filled 2018-08-19: qty 100

## 2018-08-19 MED ORDER — ONDANSETRON HCL 4 MG/2ML IJ SOLN
INTRAMUSCULAR | Status: AC
  Filled 2018-08-19: qty 2

## 2018-08-19 MED ORDER — LIDOCAINE-EPINEPHRINE 1 %-1:100000 IJ SOLN
INTRAMUSCULAR | Status: DC | PRN
  Administered 2018-08-19: 5 mL

## 2018-08-19 MED ORDER — OXYCODONE HCL 5 MG PO TABS
5.0000 mg | ORAL_TABLET | ORAL | Status: DC | PRN
  Administered 2018-08-19 – 2018-08-20 (×3): 5 mg via ORAL
  Filled 2018-08-19 (×3): qty 1

## 2018-08-19 MED ORDER — SENNA 8.6 MG PO TABS
1.0000 | ORAL_TABLET | Freq: Two times a day (BID) | ORAL | Status: DC
  Administered 2018-08-19 – 2018-08-20 (×2): 8.6 mg via ORAL
  Filled 2018-08-19 (×3): qty 1

## 2018-08-19 MED ORDER — DEXAMETHASONE SODIUM PHOSPHATE 4 MG/ML IJ SOLN
INTRAMUSCULAR | Status: AC
  Filled 2018-08-19: qty 2

## 2018-08-19 MED ORDER — POLYETHYLENE GLYCOL 3350 17 G PO PACK: 17.0000 g | PACK | Freq: Every day | ORAL | Status: DC | PRN

## 2018-08-19 MED ORDER — MENTHOL 3 MG MT LOZG
1.0000 | LOZENGE | OROMUCOSAL | Status: DC | PRN
  Filled 2018-08-19: qty 9

## 2018-08-19 MED ORDER — DEXAMETHASONE SODIUM PHOSPHATE 10 MG/ML IJ SOLN
INTRAMUSCULAR | Status: DC | PRN
  Administered 2018-08-19: 10 mg via INTRAVENOUS

## 2018-08-19 MED ORDER — DEXTROSE 5 % IV SOLN
3.0000 g | Freq: Once | INTRAVENOUS | Status: AC
  Administered 2018-08-19: 3 g via INTRAVENOUS
  Filled 2018-08-19: qty 3

## 2018-08-19 MED ORDER — SODIUM CHLORIDE 0.9% FLUSH
3.0000 mL | Freq: Two times a day (BID) | INTRAVENOUS | Status: DC
  Administered 2018-08-19: 3 mL via INTRAVENOUS

## 2018-08-19 MED ORDER — FENTANYL CITRATE (PF) 100 MCG/2ML IJ SOLN
INTRAMUSCULAR | Status: AC
  Filled 2018-08-19: qty 2

## 2018-08-19 MED ORDER — GLYCOPYRROLATE 0.2 MG/ML IJ SOLN
INTRAMUSCULAR | Status: AC
  Filled 2018-08-19: qty 1

## 2018-08-19 MED ORDER — METHOCARBAMOL 1000 MG/10ML IJ SOLN
500.0000 mg | Freq: Four times a day (QID) | INTRAVENOUS | Status: DC
  Filled 2018-08-19: qty 5

## 2018-08-19 MED ORDER — FENTANYL CITRATE (PF) 100 MCG/2ML IJ SOLN
INTRAMUSCULAR | Status: DC | PRN
  Administered 2018-08-19: 100 ug via INTRAVENOUS
  Administered 2018-08-19: 50 ug via INTRAVENOUS

## 2018-08-19 MED ORDER — SUGAMMADEX SODIUM 200 MG/2ML IV SOLN
INTRAVENOUS | Status: DC | PRN
  Administered 2018-08-19: 250 mg via INTRAVENOUS

## 2018-08-19 MED ORDER — LIDOCAINE HCL (PF) 2 % IJ SOLN
INTRAMUSCULAR | Status: AC
  Filled 2018-08-19: qty 10

## 2018-08-19 MED ORDER — FAMOTIDINE 20 MG PO TABS
20.0000 mg | ORAL_TABLET | Freq: Once | ORAL | Status: AC
  Administered 2018-08-19: 20 mg via ORAL

## 2018-08-19 MED ORDER — PHENOL 1.4 % MT LIQD
1.0000 | OROMUCOSAL | Status: DC | PRN
  Filled 2018-08-19: qty 177

## 2018-08-19 MED ORDER — SODIUM CHLORIDE 0.9 % IV SOLN: 250.0000 mL | INTRAVENOUS | Status: DC

## 2018-08-19 MED ORDER — ACETAMINOPHEN 325 MG PO TABS: 650.0000 mg | ORAL_TABLET | ORAL | Status: DC | PRN

## 2018-08-19 MED ORDER — FAMOTIDINE 20 MG PO TABS
ORAL_TABLET | ORAL | Status: AC
  Administered 2018-08-19: 20 mg via ORAL
  Filled 2018-08-19: qty 1

## 2018-08-19 MED ORDER — MIDAZOLAM HCL 2 MG/2ML IJ SOLN
INTRAMUSCULAR | Status: DC | PRN
  Administered 2018-08-19: 2 mg via INTRAVENOUS

## 2018-08-19 MED ORDER — LACTATED RINGERS IV SOLN
INTRAVENOUS | Status: DC
  Administered 2018-08-19: 14:00:00 via INTRAVENOUS

## 2018-08-19 MED ORDER — SODIUM CHLORIDE 0.9% FLUSH: 3.0000 mL | INTRAVENOUS | Status: DC | PRN

## 2018-08-19 MED ORDER — FENTANYL CITRATE (PF) 100 MCG/2ML IJ SOLN
INTRAMUSCULAR | Status: AC
  Administered 2018-08-19: 50 ug via INTRAVENOUS
  Filled 2018-08-19: qty 2

## 2018-08-19 MED ORDER — BISACODYL 5 MG PO TBEC: 5.0000 mg | DELAYED_RELEASE_TABLET | Freq: Every day | ORAL | Status: DC | PRN

## 2018-08-19 MED ORDER — KETAMINE HCL 50 MG/ML IJ SOLN
INTRAMUSCULAR | Status: DC | PRN
  Administered 2018-08-19 (×2): 25 mg via INTRAVENOUS

## 2018-08-19 MED ORDER — OXYCODONE HCL 5 MG PO TABS
10.0000 mg | ORAL_TABLET | ORAL | Status: DC | PRN
  Administered 2018-08-20: 10 mg via ORAL
  Filled 2018-08-19: qty 2

## 2018-08-19 SURGICAL SUPPLY — 66 items
BIT DRILL 13 (BIT) ×1 IMPLANT
BIT DRILL 13MM (BIT) ×1
BLADE BOVIE TIP EXT 4 (BLADE) ×3 IMPLANT
BLADE SURG 15 STRL LF DISP TIS (BLADE) ×1 IMPLANT
BLADE SURG 15 STRL SS (BLADE) ×2
BONE WEDGE CONERSTONE 6X14X11 (Bone Implant) ×2 IMPLANT
BONE WEDGE CONERSTONE 7X14X11 (Bone Implant) ×2 IMPLANT
BUR DIAMOND COARSE 4.0 RND (BURR) ×3 IMPLANT
BUR NEURO DRILL SOFT 3.0X3.8M (BURR) ×3 IMPLANT
CANISTER SUCT 1200ML W/VALVE (MISCELLANEOUS) ×3 IMPLANT
CHLORAPREP W/TINT 26ML (MISCELLANEOUS) ×6 IMPLANT
COLLAR CERV MED MED DENS 3 (SOFTGOODS) IMPLANT
COLLAR CERV SM MED DENS 3 (SOFTGOODS) IMPLANT
COLLAR CERV XL MED DENS 3 (SOFTGOODS) IMPLANT
COUNTER NEEDLE 20/40 LG (NEEDLE) ×3 IMPLANT
COVER LIGHT HANDLE STERIS (MISCELLANEOUS) ×6 IMPLANT
COVER WAND RF STERILE (DRAPES) ×3 IMPLANT
CRADLE LAMINECT ARM (MISCELLANEOUS) ×3 IMPLANT
CUP MEDICINE 2OZ PLAST GRAD ST (MISCELLANEOUS) ×6 IMPLANT
DERMABOND ADVANCED (GAUZE/BANDAGES/DRESSINGS) ×2
DERMABOND ADVANCED .7 DNX12 (GAUZE/BANDAGES/DRESSINGS) ×1 IMPLANT
DRAPE MICROSCOPE SPINE 48X150 (DRAPES) ×3 IMPLANT
DRAPE SURG 17X11 SM STRL (DRAPES) ×6 IMPLANT
DRAPE THYROID T SHEET (DRAPES) ×3 IMPLANT
ELECT CAUTERY BLADE TIP 2.5 (TIP) ×3
ELECT EZSTD 165MM 6.5IN (MISCELLANEOUS) ×3
ELECTRODE CAUTERY BLDE TIP 2.5 (TIP) ×1 IMPLANT
ELECTRODE EZSTD 165MM 6.5IN (MISCELLANEOUS) ×1 IMPLANT
FEE INTRAOP MONITOR IMPULS NCS (MISCELLANEOUS) IMPLANT
GAUZE SPONGE 4X4 12PLY STRL (GAUZE/BANDAGES/DRESSINGS) ×3 IMPLANT
GLOVE INDICATOR 7.0 STRL GRN (GLOVE) ×3 IMPLANT
GLOVE INDICATOR 8.0 STRL GRN (GLOVE) ×3 IMPLANT
GLOVE SURG SYN 7.0 (GLOVE) ×6 IMPLANT
GLOVE SURG SYN 7.0 PF PI (GLOVE) ×2 IMPLANT
GLOVE SURG SYN 8.0 (GLOVE) ×6 IMPLANT
GLOVE SURG SYN 8.0 PF PI (GLOVE) ×2 IMPLANT
GOWN STRL REUS W/ TWL XL LVL3 (GOWN DISPOSABLE) ×1 IMPLANT
GOWN STRL REUS W/TWL MED LVL3 (GOWN DISPOSABLE) ×3 IMPLANT
GOWN STRL REUS W/TWL XL LVL3 (GOWN DISPOSABLE) ×2
GRADUATE 1200CC STRL 31836 (MISCELLANEOUS) ×3 IMPLANT
INTRAOP MONITOR FEE IMPULS NCS (MISCELLANEOUS)
INTRAOP MONITOR FEE IMPULSE (MISCELLANEOUS)
IV CATH ANGIO 12GX3 LT BLUE (NEEDLE) ×3 IMPLANT
KIT TURNOVER KIT A (KITS) ×3 IMPLANT
MARKER SKIN DUAL TIP RULER LAB (MISCELLANEOUS) ×6 IMPLANT
NDL SPNL 22GX3.5 QUINCKE BK (NEEDLE) ×1 IMPLANT
NEEDLE HYPO 22GX1.5 SAFETY (NEEDLE) ×3 IMPLANT
NEEDLE SPNL 22GX3.5 QUINCKE BK (NEEDLE) ×3 IMPLANT
NS IRRIG 1000ML POUR BTL (IV SOLUTION) ×3 IMPLANT
PACK LAMINECTOMY NEURO (CUSTOM PROCEDURE TRAY) ×3 IMPLANT
PASTE BONE GRAFTON 1CC (Bone Implant) ×2 IMPLANT
PIN CASPAR 14 (PIN) ×1 IMPLANT
PIN CASPAR 14MM (PIN) ×3
PLATE ZEVO 2LVL 41MM (Plate) ×2 IMPLANT
SCREW 3.5 SELFDRILL 15MM VARI (Screw) ×12 IMPLANT
SPOGE SURGIFLO 8M (HEMOSTASIS) ×2
SPONGE KITTNER 5P (MISCELLANEOUS) ×3 IMPLANT
SPONGE SURGIFLO 8M (HEMOSTASIS) ×1 IMPLANT
SUT VICRYL 2-0 SH 8X27 (SUTURE) ×3 IMPLANT
SUT VICRYL 3-0 CR8 SH (SUTURE) ×3 IMPLANT
SYR 30ML LL (SYRINGE) ×3 IMPLANT
TAPE CLOTH 3X10 WHT NS LF (GAUZE/BANDAGES/DRESSINGS) ×3 IMPLANT
TOWEL OR 17X26 4PK STRL BLUE (TOWEL DISPOSABLE) ×3 IMPLANT
TRAY FOLEY MTR SLVR 16FR STAT (SET/KITS/TRAYS/PACK) IMPLANT
TUBING CONNECTING 10 (TUBING) ×2 IMPLANT
TUBING CONNECTING 10' (TUBING) ×1

## 2018-08-19 NOTE — Anesthesia Postprocedure Evaluation (Signed)
Anesthesia Post Note  Patient: Chris Hart  Procedure(s) Performed: ANTERIOR CERVICAL DECOMPRESSION/DISCECTOMY FUSION 2 LEVEL (N/A Neck)  Patient location during evaluation: PACU Anesthesia Type: General Level of consciousness: awake and alert Pain management: pain level controlled Vital Signs Assessment: post-procedure vital signs reviewed and stable Respiratory status: spontaneous breathing, nonlabored ventilation and respiratory function stable Cardiovascular status: blood pressure returned to baseline and stable Postop Assessment: no apparent nausea or vomiting Anesthetic complications: no     Last Vitals:  Vitals:   08/19/18 1947 08/19/18 2009  BP: 110/85 121/81  Pulse: 85 88  Resp: 19 20  Temp: 36.8 C 36.7 C  SpO2: 95% 95%    Last Pain:  Vitals:   08/19/18 2009  TempSrc: Axillary  PainSc:                  Jovita Gamma

## 2018-08-19 NOTE — Progress Notes (Signed)
Ancef 3 g IV x1 ordered for surgical ppx. Pt wt 126.6 kg.

## 2018-08-19 NOTE — Anesthesia Preprocedure Evaluation (Signed)
Anesthesia Evaluation  Patient identified by MRN, date of birth, ID band Patient awake    Reviewed: Allergy & Precautions, H&P , NPO status , Patient's Chart, lab work & pertinent test results, reviewed documented beta blocker date and time   History of Anesthesia Complications Negative for: history of anesthetic complications  Airway Mallampati: II  TM Distance: >3 FB Neck ROM: full    Dental  (+) Dental Advidsory Given, Teeth Intact   Pulmonary neg shortness of breath, neg sleep apnea, neg COPD, Recent URI  (Flu a couple weeks ago), Resolved,           Cardiovascular Exercise Tolerance: Good negative cardio ROS       Neuro/Psych Seizures - (as a child, none in 20 years), Well Controlled,  negative psych ROS   GI/Hepatic negative GI ROS, Neg liver ROS,   Endo/Other  negative endocrine ROS  Renal/GU negative Renal ROS  negative genitourinary   Musculoskeletal   Abdominal   Peds  Hematology negative hematology ROS (+)   Anesthesia Other Findings Past Medical History: No date: Epilepsy (HCC)     Comment:  as a child   Reproductive/Obstetrics negative OB ROS                             Anesthesia Physical Anesthesia Plan  ASA: II  Anesthesia Plan: General   Post-op Pain Management:    Induction: Intravenous  PONV Risk Score and Plan: 2 and Ondansetron, Dexamethasone, Promethazine, Treatment may vary due to age or medical condition and Midazolam  Airway Management Planned: Oral ETT  Additional Equipment:   Intra-op Plan:   Post-operative Plan: Extubation in OR  Informed Consent: I have reviewed the patients History and Physical, chart, labs and discussed the procedure including the risks, benefits and alternatives for the proposed anesthesia with the patient or authorized representative who has indicated his/her understanding and acceptance.     Dental Advisory  Given  Plan Discussed with: Anesthesiologist, CRNA and Surgeon  Anesthesia Plan Comments:         Anesthesia Quick Evaluation

## 2018-08-19 NOTE — Anesthesia Post-op Follow-up Note (Signed)
Anesthesia QCDR form completed.        

## 2018-08-19 NOTE — Op Note (Signed)
Operative Note 08/19/2018  PRE-OP DIAGNOSIS:  Cervical Radiculopathy[M54.12] * Herniated nucleus pulposus, cervical [M50.20]  POST-OP DIAGNOSIS:Post-Op Diagnosis Codes: Cervical Radiculopathy[M54.12] * Herniated nucleus pulposus, cervical [M50.20]   Procedure(s): 1.ARTHRODESIS, ANTERIOR INTERBODY, INCLUDING DISC SPACE PREPARATION, DISCECTOMY, OSTEOPHYTECTOMY AND DECOMPRESSION OF SPINAL CORD AND/ORNERVE ROOTS; CERVICAL BELOW C2--C5/6ACDF  2.ARTHRODESIS ANTERIOR INTERBODY W/DISCECTOMY ADDITIONALLEVELC6/7 3.ANTERIOR INSTRUMENTATION; 2 TO 3 VERTEBRAL SEGMENTS (LIST IN ADDITION TO PRIMARY PROCEDURE) 4.ALLOGRAFT, STRUCTURAL, FOR SPINE SURGERY ONLY (LIST IN ADDITION TO PRIMARY PROCEDURE)  SURGEON: Surgeon(s) and Role: * Nathaniel Man, MD - Primary  Ivar Drape, [place], Asisstant  ANESTHESIA:General   OPERATIVE FINDINGS: Stenosis atC5/6and C6/7  OPERATIVE REPORT:   Indications Mr.Chris Hart to our clinic on2/4 with ongoing pain in left arm.He had failed conservative management of prescription medications, oral steroids, and steroid injections. He had a MRI that showed disc herniation causing severe stenosis at C5/6andC6/7.Given this, we recommended an anterior cervical decompression and fusion to relieve the pressure on thespinal cord and allow for healing.The risks of hematoma, infection, poor bone healing and failure of fusion, cord injury, weakness, numbness, neck pain, stroke, and death were discussed in detail. All questions were answered and the patient elected to proceed with the surgery.  Procedure After obtaining informed consent, the patient was taken to the Operating Room where general anesthesia was induced and the patient intubated. Vascular access was obtained. Decadronand antibioticswereadministered. The head was slightly extended and imaging used to identify a skin crease overlying the C6vertebral body. Appropriate  padding was performed.   The patient was prepped and draped in the usual sterile fashion and a timeout was performed per protocol. Local anesthesia was instilled with epinephrine along the planned incision site. A transverse cervicalincision was performed on the left in a skin crease. The incision was carried to the level of the platysma and then cautery was used to incise the muscle. Blunt dissection was used to expand the plane and the dissection was carried deep medial to the SCM and carotid sheath being careful to identify the trachea and esophagus medially. The prevertebral fascia was identified and this was bluntly dissected to expose the disc spaces. A needle was placed in the disc space and x-ray confirmed the C5/6disc level.   Next, cautery was used to undermine the longus colli muscles bilaterally and identify the C5/6and C6/7disc space. Caspar pins were placed at C5and C7and a retractor system placed under the muscles to complete the exposure. Next, a combination of curettes and Kerrison rongeurs were used to remove the anterior osteophyte at C5/6and then the disc material. A 73mm matchstick was used to shave the endplates of the adjacent bodies. A trial spacer was used to size the graft and then hemostasis obtained.  Next, the retractors were moved to the C6/7disc space.Next, a combination of curettes and Kerrison rongeurs were used to remove the anterior osteophyte at C6/7and then the disc material. A 48mm matchstick was used to shave the endplates of the adjacent bodies. A trial spacer was used to size the graft and then hemostasis obtained.  The microscope was brought into the field for the remainder of the surgery. The matchsitickdrill bitwas used to remove the osteophyte/disc complex deep to the level of the PLL at C5/6. There was disc protrusion at this level that was removed with rongeurs.There was a large disc fragment on the left.The PLL was entered with a hook and  then the PLL was removed along with remaining disc material to decompress centrally and then out into bilateral neuro foramen. A blunt  probe was used to confirm no residual stenosis laterally and a curette used to ensure no posterior osteophyte remained. Floseal was used for hemostasis. Allograft was placed, 13mm in height and placed slightly recessed to theanterior edge of vertebral bodyto promote arthrodesis.  Next, the C6/7 disc space was started. The matchsitickdrill bitwas used to remove the osteophyte/disc complex deep to the level of the PLL at C6/7. There was disc protrusion at this level with a significant osteophyte causing compression that was removed with rongeurs.The PLL was entered with a hook and then the PLL was removed along with remaining disc material to decompress centrally and then out into bilateral neuro foramen. There was significant disc material in the left recess.  A blunt probe was used to confirm no residual stenosis laterally and a curette used to ensure no posterior osteophyte remained. Floseal was used for hemostasis. Allograft was placed, 9mm in height and placed slightly recessed to theanterior edge of vertebral bodyto promote arthrodesis.  The caspar pins were removed and bone wax placed. The remainder of the osteophytes were removedto allow for plating. Next, atwo level plate was found to be the adequate size and was placed in the midline and secured with two 1mm screws at each bone level. Xray was obtained confirming good graft placement and adequate depth of screws. The retractors were removed. The wound was irrigated copiously and hemostasis obtained. The platysma was closed with 2-0 vicryl suture. The dermis was closed with 3-0 Vicryl and Dermabond was placed on the skin.  The patient had general anesthesia reversed and was extubated following the procedure. He awoke following commands with symmetric movement. He was taken to the PACU where he continued his  recovery and then the ward.   ESTIMATED BLOOD LOSS:20cc   SPECIMENS:None    IMPLANT Medtronic 28mm screws x6 MedtronicZevoAnterior Plate87mm Medtronic6x14x79mmBone Graft Spacer    Medtronic7x14x24mmBone Graft Spacer Medtronic Putty ([id number])  ATTESTATION: I performed the procedure in its entirety with the assistance of Ivar Drape, physician assistant

## 2018-08-19 NOTE — Interval H&P Note (Signed)
History and Physical Interval Note:  08/19/2018 1:47 PM  Chris Hart  has presented today for surgery, with the diagnosis of Cervical Radiculopathy  The various methods of treatment have been discussed with the patient and family. After consideration of risks, benefits and other options for treatment, the patient has consented to  Procedure(s): ANTERIOR CERVICAL DECOMPRESSION/DISCECTOMY FUSION 2 LEVEL/HARDWARE REMOVAL C5/6; C6/7 (N/A) as a surgical intervention .  The patient's history has been reviewed, patient examined, no change in status, stable for surgery.  I have reviewed the patient's chart and labs.  Questions were answered to the patient's satisfaction.     Lucy Chris

## 2018-08-19 NOTE — H&P (Signed)
Chris Hart is an 33 y.o. male.   Chief Complaint: Left arm pain HPI: Chris Hart returns with ongoing left arm pain. He has now completed multiple steroid tapers and a cervical ESI. He is not getting significant relief from any of these. The largest amount of his pain is down around his left elbow but will go down to his left fingers. He is also having significant neck pain in the left scapular area. He is finding difficulty to sleep or function with the left arm pain he does use heat and ice at home.  MRI revealed stenosis on the left at C5/6 and C6/7. We discussed ACDf at these levels to relieve the pain.    Past Medical History:  Diagnosis Date  . Epilepsy [facility])    as a child    Past Surgical History:  Procedure Laterality Date  . WISDOM TOOTH EXTRACTION      History reviewed. No pertinent family history. Social History:  reports that he has never smoked. He has never used smokeless tobacco. He reports current alcohol use. He reports that he does not use drugs.  Allergies:  Allergies  Allergen Reactions  . Bactrim [Sulfamethoxazole-Trimethoprim] Itching and Rash    Medications Prior to Admission  Medication Sig Dispense Refill  . acetaminophen (TYLENOL) 500 MG tablet Take 1,000 mg by mouth every 6 (six) hours as needed for mild pain or moderate pain.    . promethazine-codeine (PHENERGAN WITH CODEINE) 6.25-10 MG/5ML syrup Take 5 mLs by mouth every 6 (six) hours as needed for cough. 120 mL 0  . tiZANidine (ZANAFLEX) 2 MG tablet Take 2 mg by mouth 3 (three) times daily.    . traMADol (ULTRAM) 50 MG tablet Take 1 tablet (50 mg total) by mouth every 12 (twelve) hours as needed. 12 tablet 0    Results for orders placed or performed during the hospital encounter of 08/19/18 (from the past 48 hour(s))  Type and screen All Cardiac and thoracic surgeries, spinal fusions, myomectomies, craniotomies, colon & liver resections, total joint revisions, same day c-section with placenta  previa or accreta.     Status: None (Preliminary result)   Collection Time: 08/19/18  1:13 PM  Result Value Ref Range   ABO/RH(D) PENDING    Antibody Screen PENDING    Sample Expiration      08/22/2018 Performed at [facility], [address] [place], [place] [address]    No results found.  ROS General ROS: Negative Respiratory ROS: Negative Cardiovascular ROS: Negative Gastrointestinal ROS: Negative Genito-Urinary ROS: Negative Musculoskeletal ROS: Positive for neck pain Neurological ROS: Positive for left arm pain, numbness Dermatological ROS: Negative   Blood pressure 126/83, pulse 82, temperature (!) 97.1 F (36.2 C), temperature source Tympanic, resp. rate 15, SpO2 95 %. Physical Exam  General appearance: Alert, cooperative, in no acute distress Head: Normocephalic CV: Regular rate Pulm: Clear to auscultation  Neurologic exam:  Mental status: alertness: alert, affect: normal Motor:strength symmetric 5 out of 5 in right upper extremity, 4+ out of 5 throughout the left upper extremity Sensory: intact to light touch in bilateral upper extremities Reflexes: 2+ and symmetric bilaterally for biceps, negative Hoffman's Gait: normal   Assessment/Plan C5-6 and C6-7 ACDF   Lucy Chris, MD 08/19/2018, 1:45 PM

## 2018-08-19 NOTE — Progress Notes (Signed)
Procedure: C5-6, C6-7 ACDF Procedure date: 08/19/2018 Diagnosis: Cervical radiculopathy  History: Chris Hart is s/p C5-6 and C6-7 ACDF for cervical radiculopathy.    POD0: Tolerated procedure well without complication.  Seen postoperatively still disoriented from anesthesia but able to somewhat answer questions and obey commands.  Physical Exam: Vitals:   08/19/18 1252 08/19/18 1811  BP: 126/83 106/69  Pulse: 82 91  Resp: 15 13  Temp: (!) 97.1 F (36.2 C) (!) 97.2 F (36.2 C)  SpO2: 95% 100%   Strength:5/5 throughout upper extremities Sensation: Unable to accurately assess Skin: Glue intact at incision site  Data:  No results for input(s): NA, K, CL, CO2, BUN, CREATININE, LABGLOM, GLUCOSE, CALCIUM in the last 168 hours. No results for input(s): AST, ALT, ALKPHOS in the last 168 hours.  Invalid input(s): TBILI   No results for input(s): WBC, HGB, HCT, PLT in the last 168 hours. No results for input(s): APTT, INR in the last 168 hours.       Other tests/results: Cervical x-rays pending  Assessment/Plan:  Chris Hart is POD 0 status post C5-6 and C6-7 ACDF for cervical radiculopathy.  Brief neuro exam intact.  Continue pain control with Tylenol, oxycodone and Robaxin as needed.  Admit for observation.  - mobilize - pain control - DVT prophylaxis  Ivar Drape PA-C Department of Neurosurgery

## 2018-08-19 NOTE — Anesthesia Procedure Notes (Signed)
Procedure Name: Intubation Performed by: Mathews Argyle, CRNA Pre-anesthesia Checklist: Patient identified, Patient being monitored, Timeout performed, Emergency Drugs available and Suction available Patient Re-evaluated:Patient Re-evaluated prior to induction Oxygen Delivery Method: Circle system utilized Preoxygenation: Pre-oxygenation with 100% oxygen Induction Type: IV induction Ventilation: Mask ventilation without difficulty Laryngoscope Size: McGraph and 4 Grade View: Grade I Tube type: Oral Tube size: 7.5 mm Number of attempts: 1 Airway Equipment and Method: Stylet and Video-laryngoscopy Placement Confirmation: ETT inserted through vocal cords under direct vision,  positive ETCO2 and breath sounds checked- equal and bilateral Secured at: 23 cm Tube secured with: Tape Dental Injury: Teeth and Oropharynx as per pre-operative assessment  Comments: Head/neck neutral alignment with induction and intubation

## 2018-08-19 NOTE — Transfer of Care (Signed)
Immediate Anesthesia Transfer of Care Note  Patient: Chris Hart  Procedure(s) Performed: ANTERIOR CERVICAL DECOMPRESSION/DISCECTOMY FUSION 2 LEVEL (N/A Neck)  Patient Location: PACU  Anesthesia Type:General  Level of Consciousness: sedated  Airway & Oxygen Therapy: Patient connected to face mask oxygen  Post-op Assessment: Post -op Vital signs reviewed and stable  Post vital signs: stable  Last Vitals:  Vitals Value Taken Time  BP 106/69 08/19/2018  6:10 PM  Temp    Pulse 95 08/19/2018  6:12 PM  Resp 12 08/19/2018  6:12 PM  SpO2 99 % 08/19/2018  6:12 PM  Vitals shown include unvalidated device data.  Last Pain:  Vitals:   08/19/18 1252  TempSrc: Tympanic  PainSc: 0-No pain         Complications: No apparent anesthesia complications

## 2018-08-20 ENCOUNTER — Observation Stay: Payer: 59

## 2018-08-20 ENCOUNTER — Encounter: Payer: Self-pay | Admitting: Neurosurgery

## 2018-08-20 DIAGNOSIS — M50122 Cervical disc disorder at C5-C6 level with radiculopathy: Secondary | ICD-10-CM | POA: Diagnosis not present

## 2018-08-20 MED ORDER — METHOCARBAMOL 500 MG PO TABS: 500.0000 mg | ORAL_TABLET | Freq: Four times a day (QID) | ORAL | 0 refills | Status: DC

## 2018-08-20 MED ORDER — OXYCODONE HCL 5 MG PO TABS: 5.0000 mg | ORAL_TABLET | ORAL | 0 refills | Status: DC | PRN

## 2018-08-20 NOTE — Discharge Instructions (Signed)

## 2018-08-20 NOTE — Progress Notes (Signed)
Discharge instructions and prescription given to pt. IVs removed. Pt dressed and discharging home with wife.

## 2018-08-20 NOTE — Discharge Summary (Signed)
Procedure: C5-6, C6-7 ACDF Procedure date: 08/19/2018 Diagnosis: Cervical radiculopathy  History: Chris Hart is s/p C5-6 and C6-7 ACDF for cervical radiculopathy.    POD1: Recovering well. Upper extremity pain that was present prior to surgery has resolved. Complains of posterior neck pain, 5/10. Minimal issues swallowing and was able to tolerate solid food breakfast this morning. He has ambulated and voided without issue.   POD0: Tolerated procedure well without complication.  Seen postoperatively still disoriented from anesthesia but able to somewhat answer questions and obey commands.  Physical Exam: Vitals:   08/20/18 0439 08/20/18 0721  BP: 122/83 122/84  Pulse: 87 85  Resp: 19 18  Temp: 98 F (36.7 C) (!) 97.5 F (36.4 C)  SpO2: 95% 96%   Strength:5/5 throughout upper and lower extremities, except left grip 4+/5 Sensation: intact and symmetric throughout upper and lower extremities.  Skin: Glue intact at incision site. No bleeding, drainage.   Data:  No results for input(s): NA, K, CL, CO2, BUN, CREATININE, LABGLOM, GLUCOSE, CALCIUM in the last 168 hours. No results for input(s): AST, ALT, ALKPHOS in the last 168 hours.  Invalid input(s): TBILI   No results for input(s): WBC, HGB, HCT, PLT in the last 168 hours. No results for input(s): APTT, INR in the last 168 hours.       Other tests/results:  EXAM: CERVICAL SPINE - 2-3 VIEW  COMPARISON:  08/19/2018  FINDINGS: Two views show anterior cervical discectomy and fusion from C5-C7. Components appear well positioned. No radiographically detectable complication. Minimal prevertebral swelling and a small amount of air in the soft tissue planes of the neck as expected.  IMPRESSION: Good appearance following ACDF C5-C7.   Assessment/Plan:  Chris Hart is POD 1 status post C5-6 and C6-7 ACDF for cervical radiculopathy. Symptoms that were present prior to surgery have resolved. Pain adequately  controlled. Will continue pain control with tylenol, oxycodone, and robaxin as needed. Discussed physical activity restrictions and wound care. He is scheduled to follow up in clinic in 2 weeks to monitor progress. Advised to contact office is questions or concerns arise before then.   Ivar Drape PA-C Department of Neurosurgery

## 2018-09-05 IMAGING — DX DG FOOT COMPLETE 3+V*L*
3 series · 3 of 3 positions shown · non-contrast
Comparison: None.

CLINICAL DATA: Left lateral foot pain starting a month ago after an
injury.

EXAM:
LEFT FOOT - COMPLETE 3+ VIEW

[foot ap]
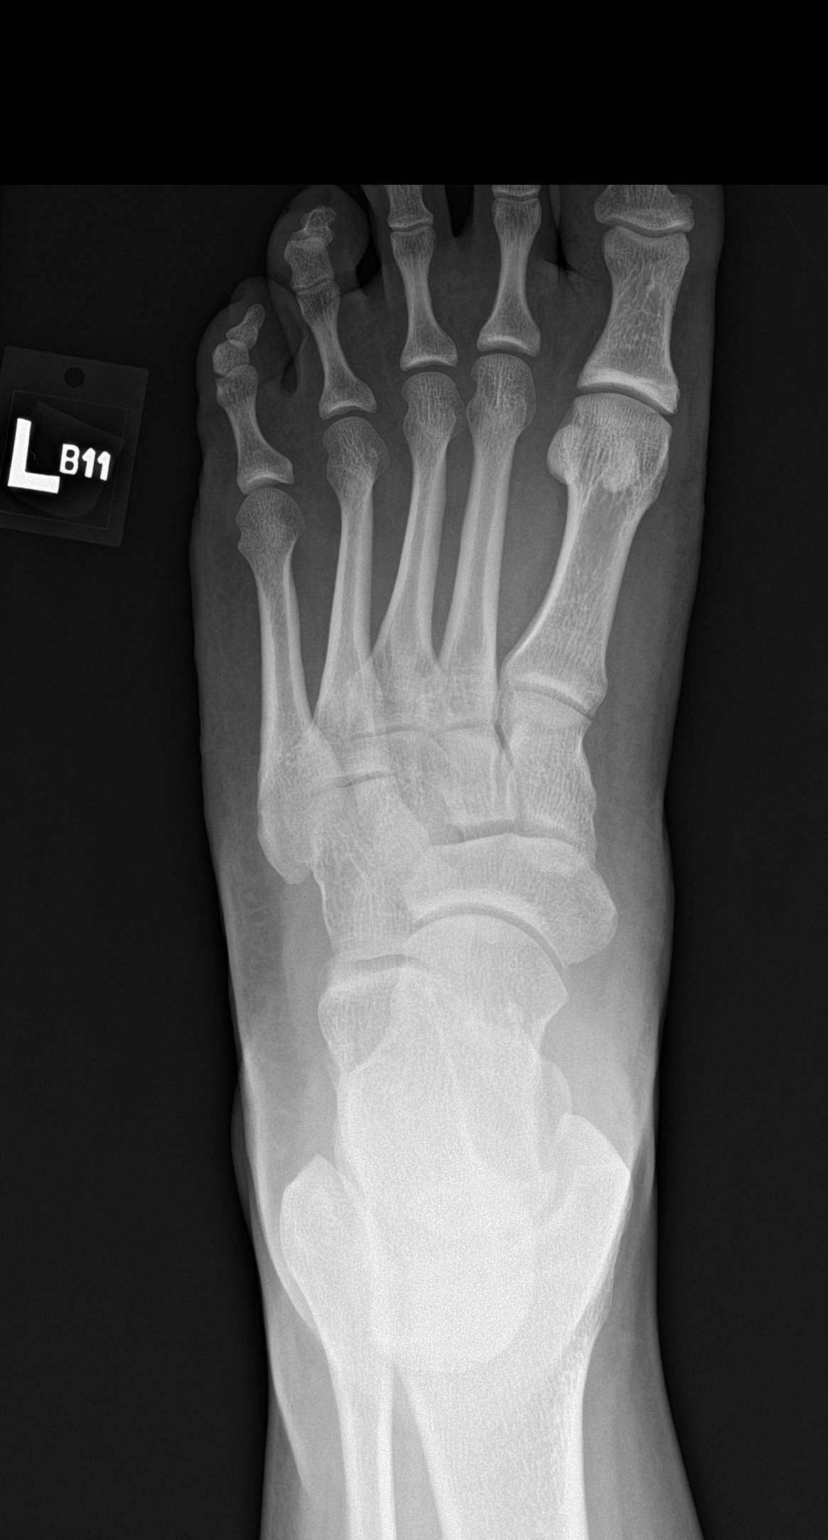

[foot obl]
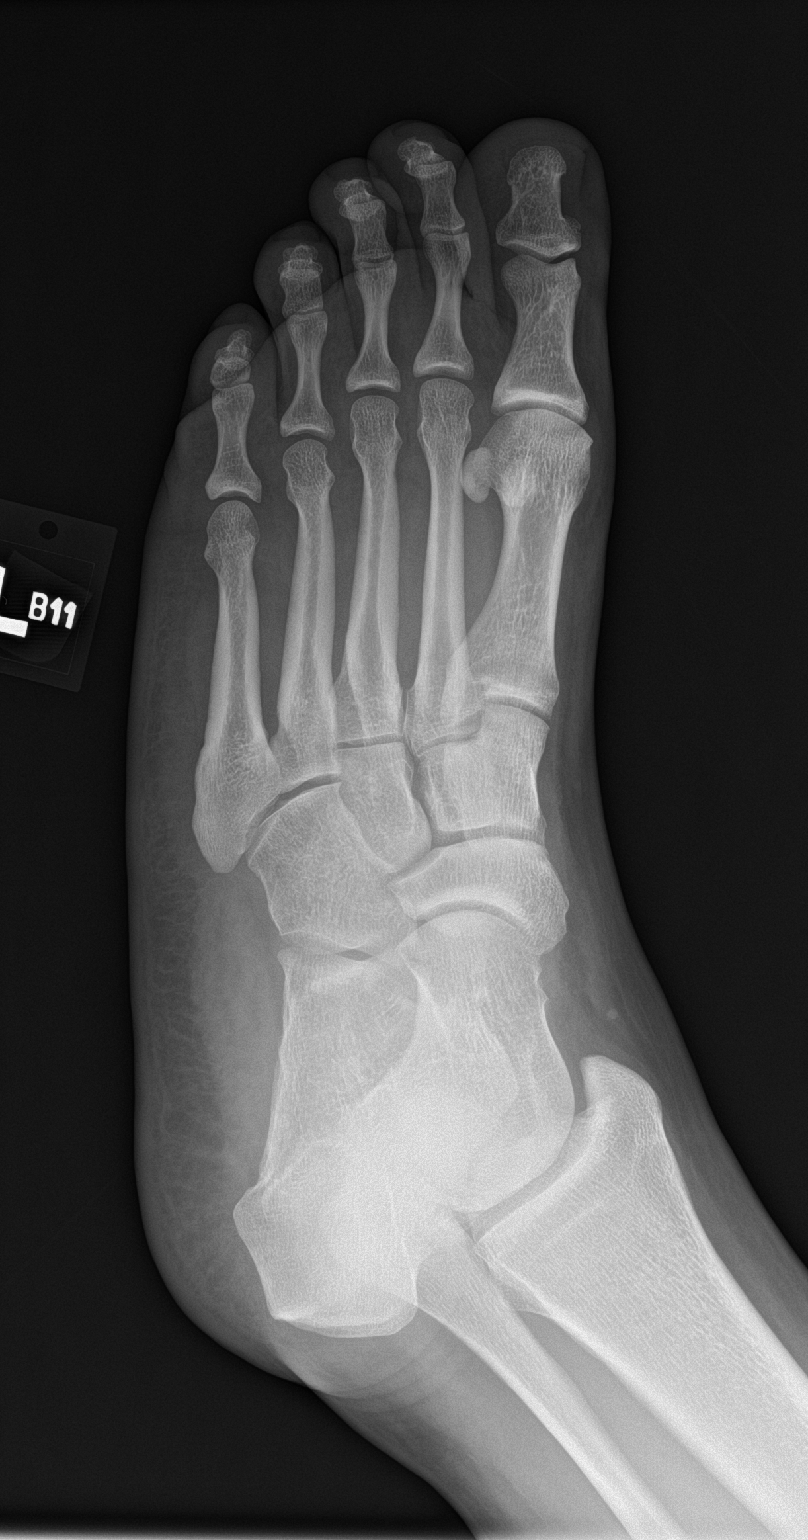

[foot lat]
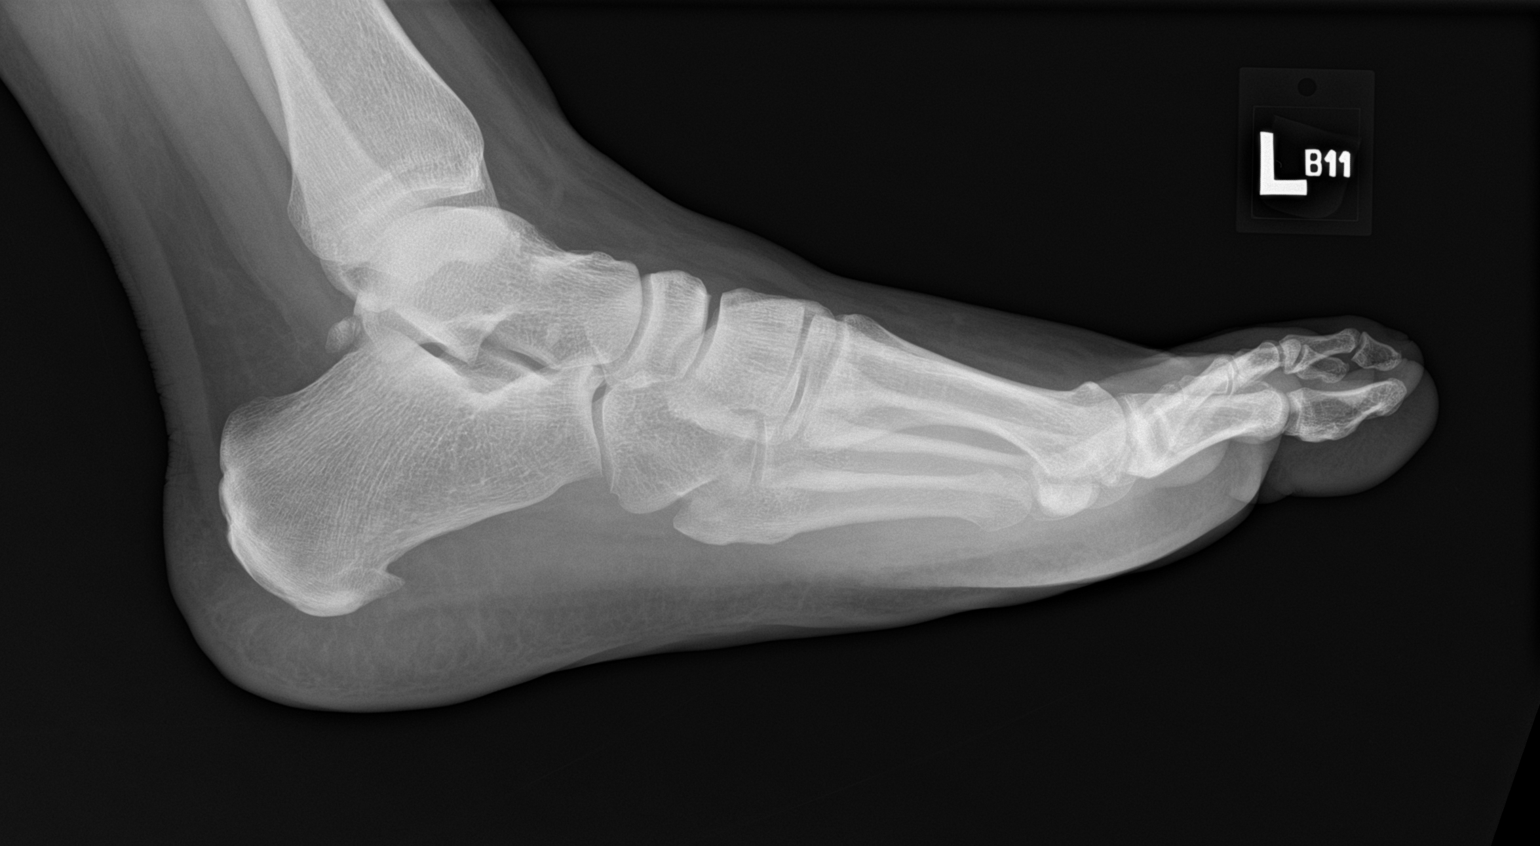

[3 of 3 positions shown; findings below may reference images not displayed]

FINDINGS: There is no evidence of fracture or dislocation. There is no
evidence of arthropathy or other focal bone abnormality. Soft
tissues are unremarkable.
IMPRESSION: Negative.

## 2019-05-01 IMAGING — RF DG CERVICAL SPINE 2 OR 3 VIEWS
1 series · 3 of 3 positions shown · non-contrast
Comparison: 07/16/2018 MR.

CLINICAL DATA: 33-year-old male for anterior decompression and
fusion. Subsequent encounter.

EXAM:
DG C-ARM 61-120 MIN; CERVICAL SPINE - 2-3 VIEW
Fluoroscopic time: 6 seconds.

[Series 1: dg x-ray · 0.14mm/px · 3 of 3 slices shown]
[im 1/3]
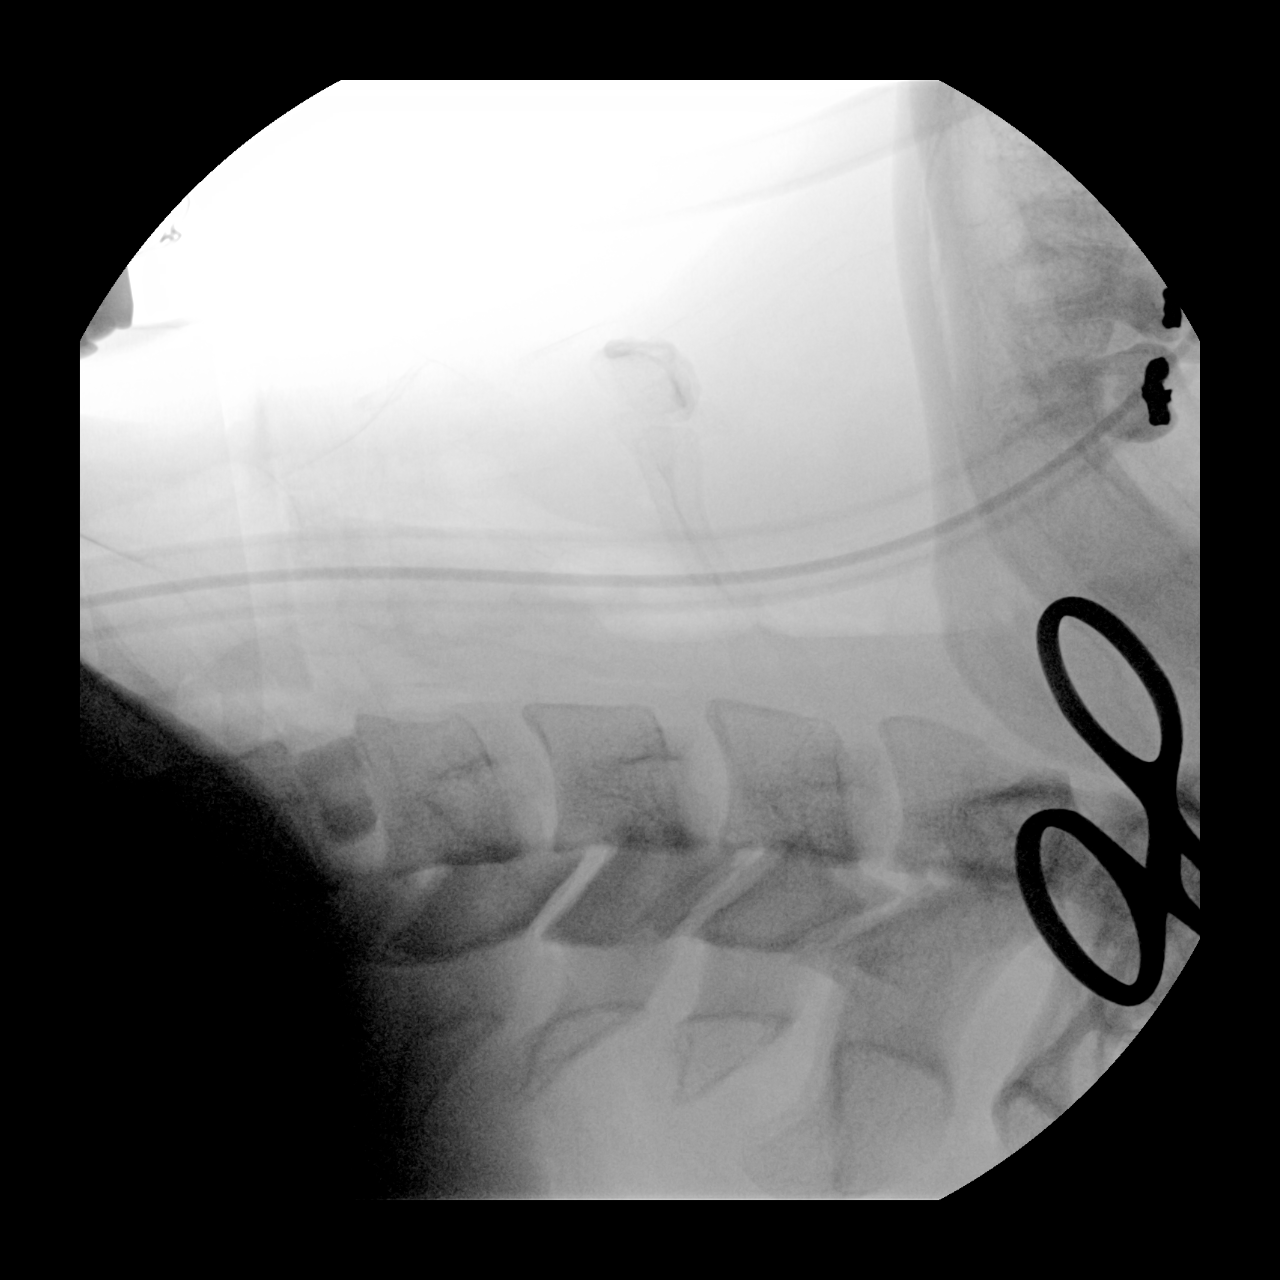
[im 2/3]
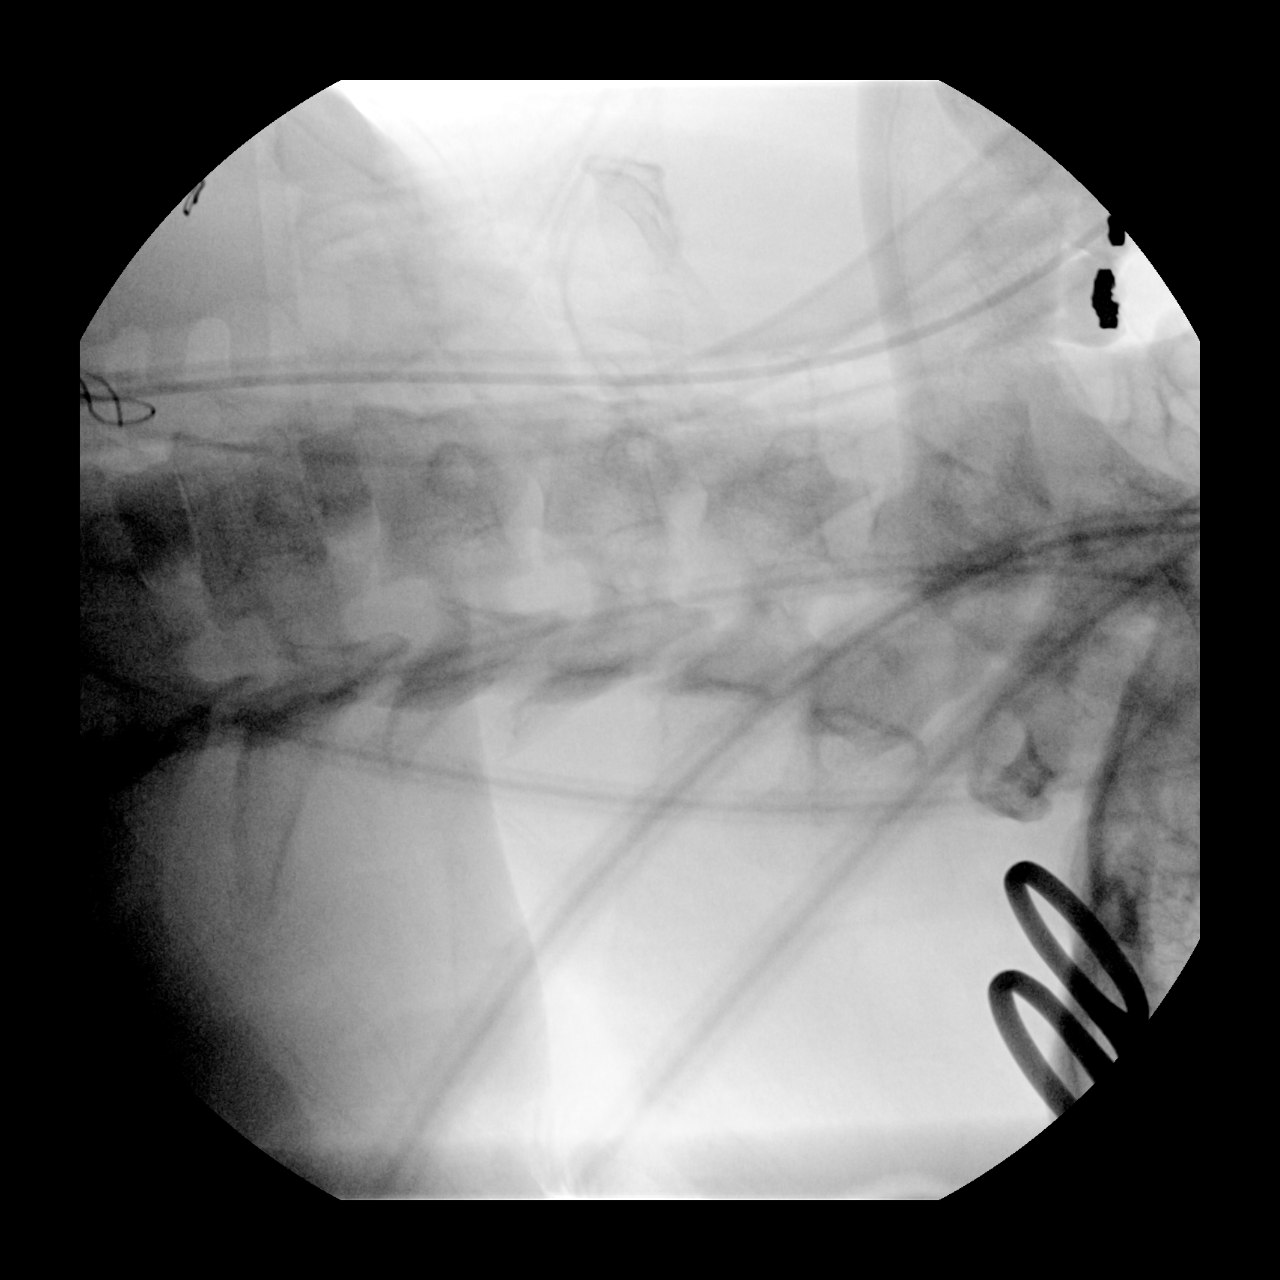
[im 3/3]
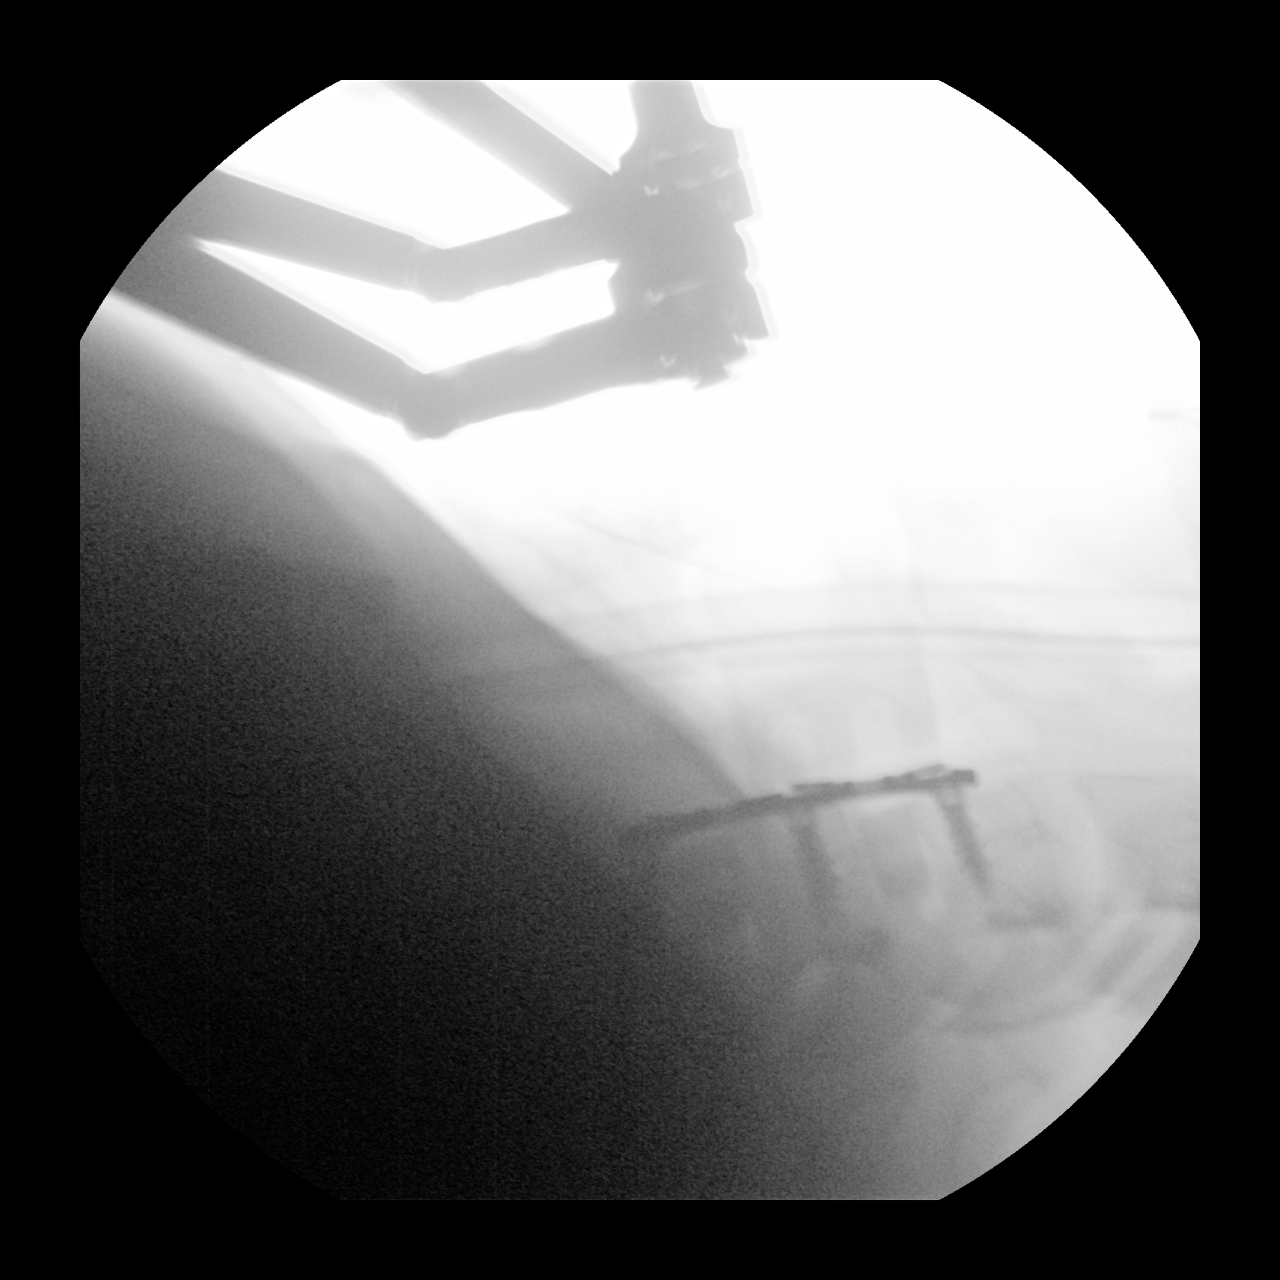

[3 of 3 positions shown; findings below may reference images not displayed]

FINDINGS: Three intraoperative lateral views of cervical spine submitted for
review after surgery.

First film reveals interbody spacer C5-6 level.

Second film is rotated and reveals 2 interbody spaces in place at
the C5-6 and C6-7 level.

Third film reveals anterior plate and screws in place. This is
probably at the C5-6 and C6-7 level although C2 was not included on
this exam (for proper level assignment). Fusion can be assessed on
follow-up standard imaging.
IMPRESSION: Appearance of fusion cervical spine probably C5-6 and C6-7 level.
This can be assessed on follow-up standard imaging.

## 2019-05-02 IMAGING — CR DG CERVICAL SPINE 2 OR 3 VIEWS
1 series · 2 of 2 positions shown · non-contrast
Comparison: 08/19/2018

CLINICAL DATA: ACDF yesterday.

EXAM:
CERVICAL SPINE - 2-3 VIEW

[Series 1: dg cervical spine 2 or 3 views · 0.14mm/px · 2 of 2 slices shown]
[im 1/2]
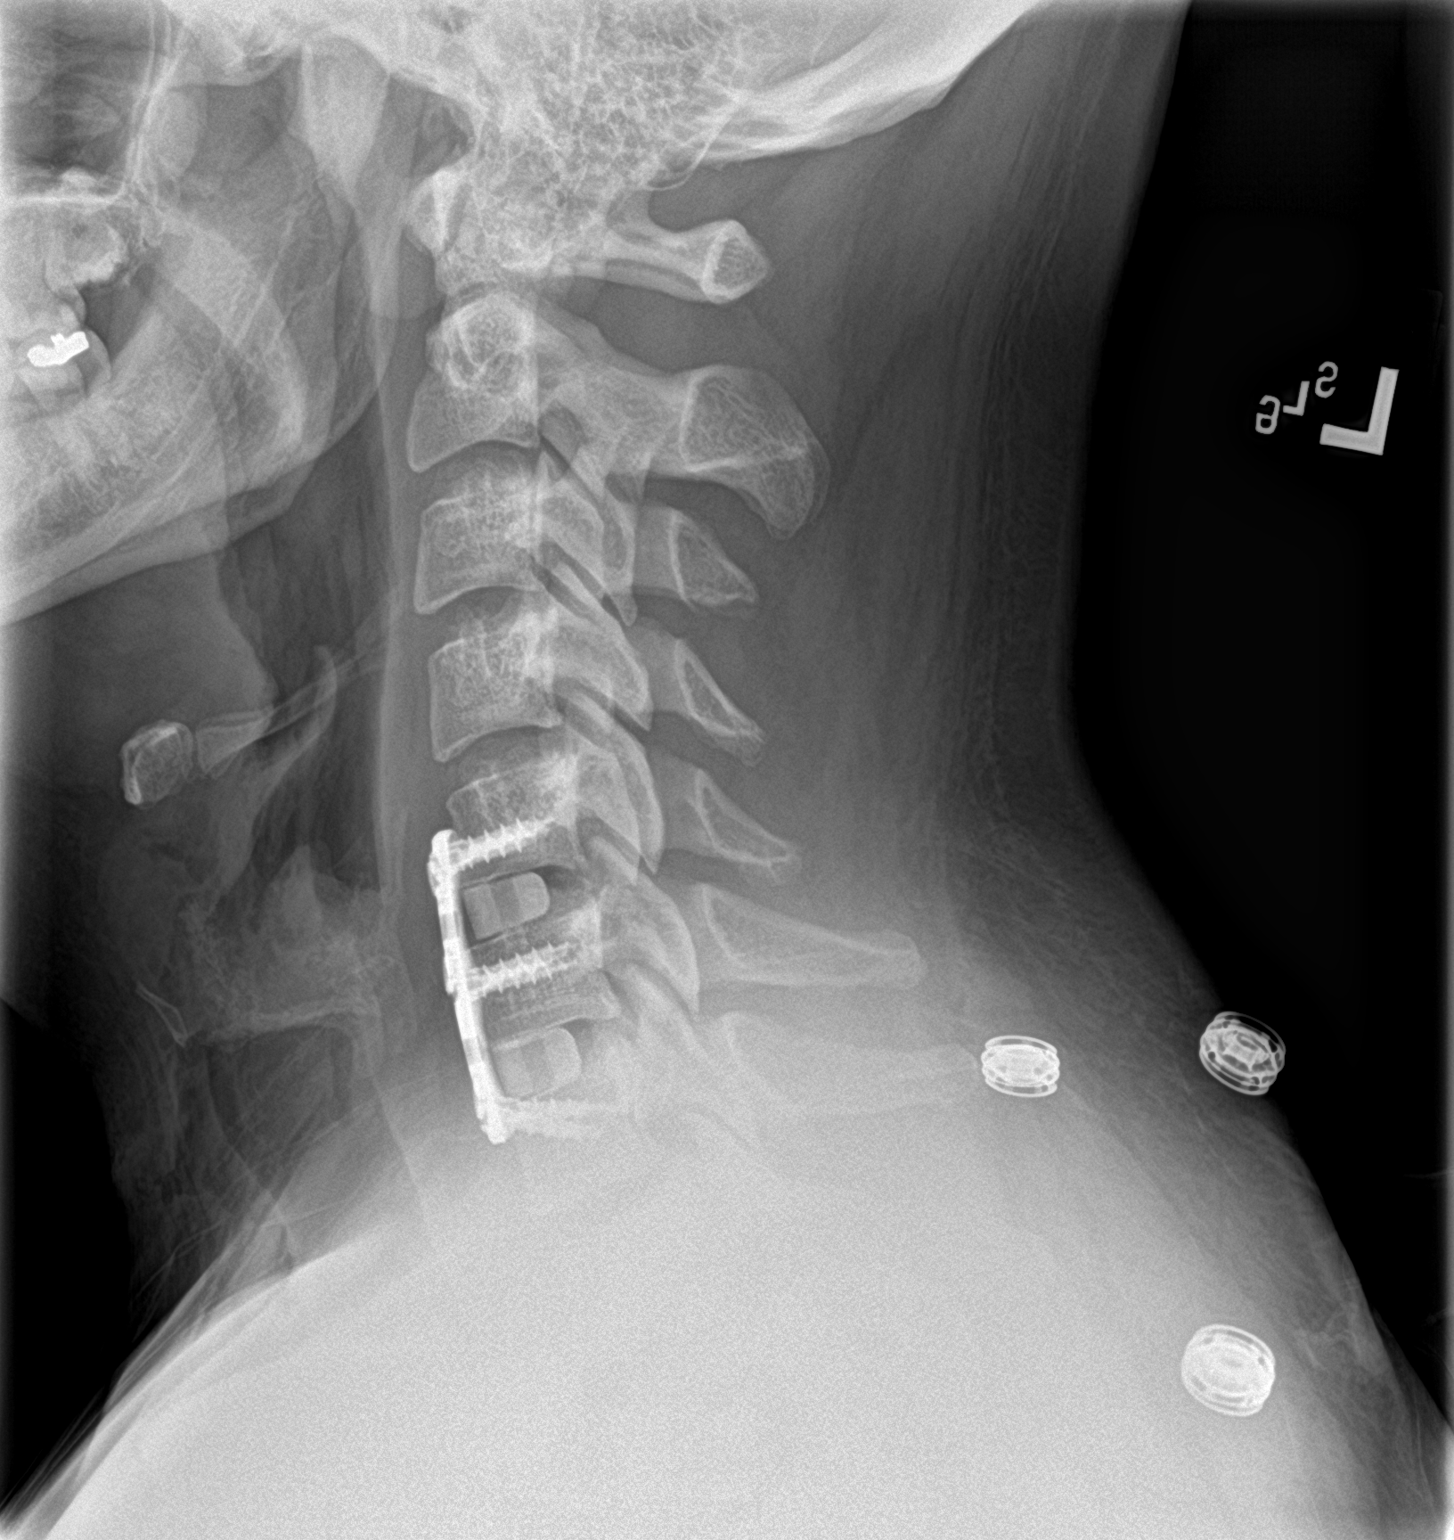
[im 2/2]
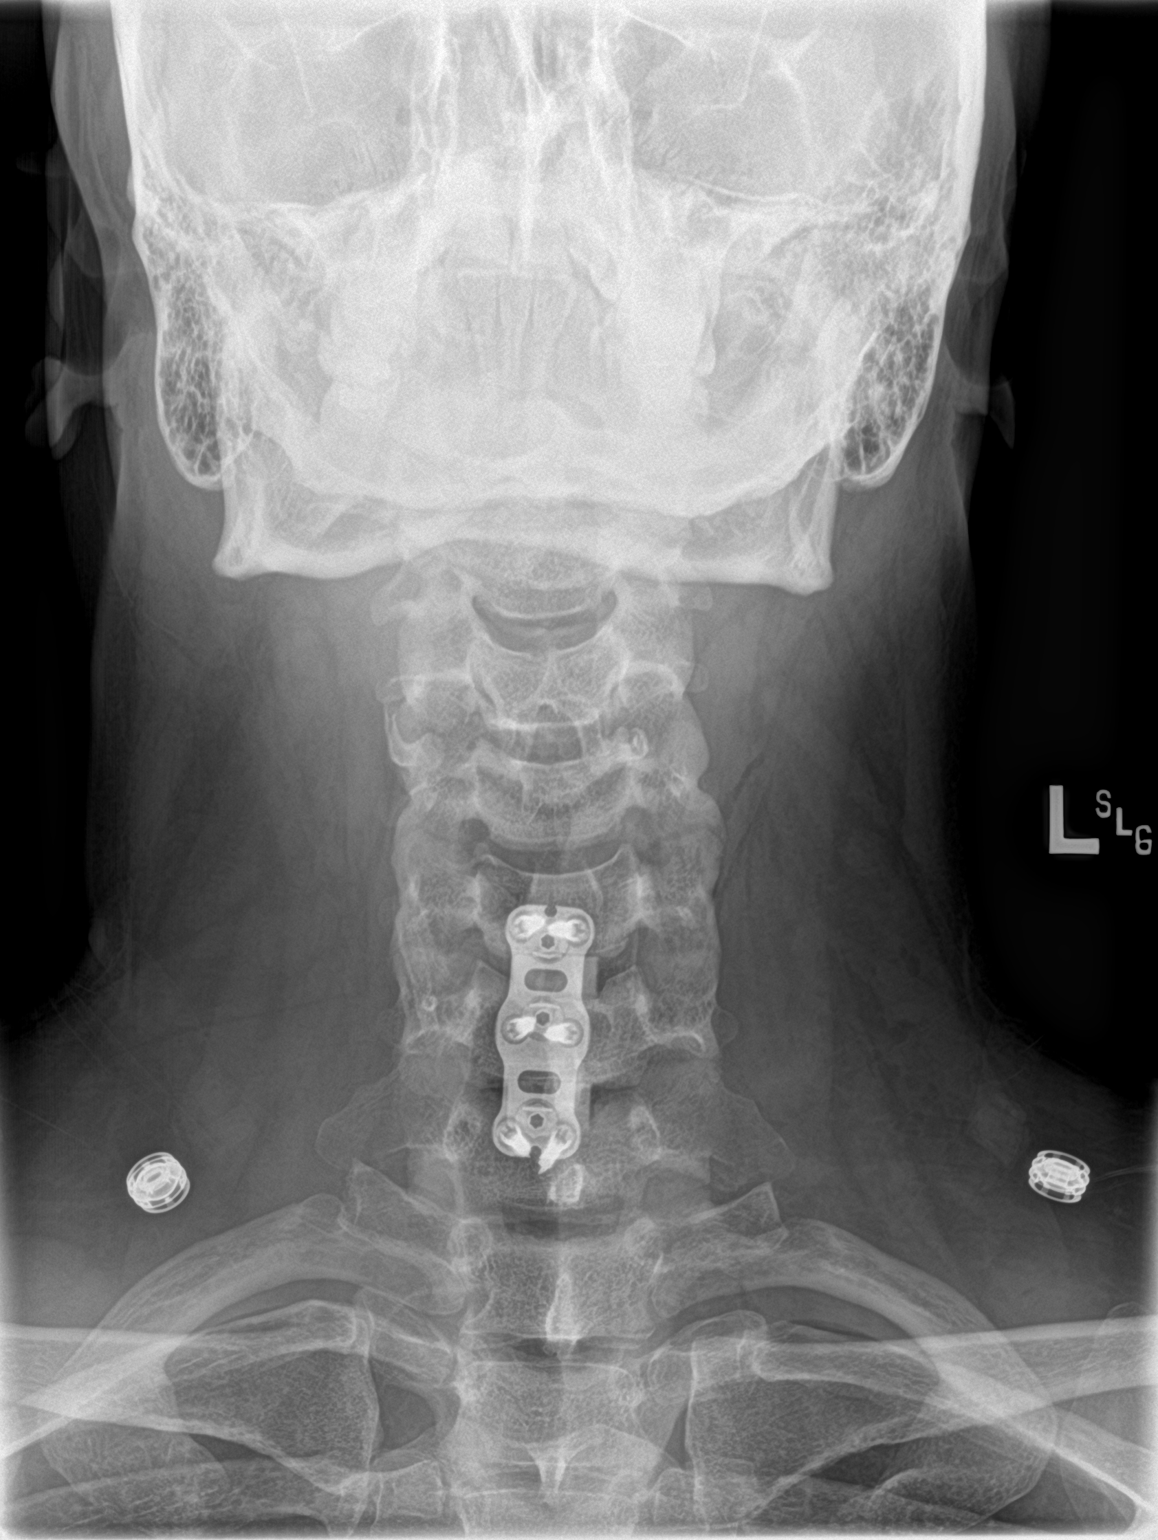

[2 of 2 positions shown; findings below may reference images not displayed]

FINDINGS: Two views show anterior cervical discectomy and fusion from C5-C7.
Components appear well positioned. No radiographically detectable
complication. Minimal prevertebral swelling and a small amount of
air in the soft tissue planes of the neck as expected.
IMPRESSION: Good appearance following ACDF C5-C7.

## 2019-05-20 ENCOUNTER — Other Ambulatory Visit: Payer: Self-pay

## 2019-05-20 DIAGNOSIS — Z20822 Contact with and (suspected) exposure to covid-19: Secondary | ICD-10-CM

## 2019-05-22 LAB — NOVEL CORONAVIRUS, NAA: SARS-CoV-2, NAA: DETECTED — AB

## 2019-05-30 ENCOUNTER — Other Ambulatory Visit: Payer: Self-pay

## 2019-05-30 DIAGNOSIS — Z20822 Contact with and (suspected) exposure to covid-19: Secondary | ICD-10-CM

## 2019-05-31 LAB — NOVEL CORONAVIRUS, NAA: SARS-CoV-2, NAA: NOT DETECTED

## 2019-07-29 ENCOUNTER — Emergency Department
Admission: EM | Admit: 2019-07-29 | Discharge: 2019-07-29 | Disposition: A | Discharge status: Home / Self Care | Payer: Self-pay | Attending: Emergency Medicine | Admitting: Emergency Medicine

## 2019-07-29 ENCOUNTER — Emergency Department: Payer: Self-pay

## 2019-07-29 ENCOUNTER — Encounter: Payer: Self-pay | Admitting: Intensive Care

## 2019-07-29 ENCOUNTER — Other Ambulatory Visit: Payer: Self-pay

## 2019-07-29 DIAGNOSIS — R0789 Other chest pain: Secondary | ICD-10-CM | POA: Insufficient documentation

## 2019-07-29 LAB — CBC
HCT: 41.5 % (ref 39.0–52.0)
Hemoglobin: 14 g/dL (ref 13.0–17.0)
MCH: 29.7 pg (ref 26.0–34.0)
MCHC: 33.7 g/dL (ref 30.0–36.0)
MCV: 87.9 fL (ref 80.0–100.0)
Platelets: 300 10*3/uL (ref 150–400)
RBC: 4.72 MIL/uL (ref 4.22–5.81)
RDW: 12.9 % (ref 11.5–15.5)
WBC: 11.3 10*3/uL — ABNORMAL HIGH (ref 4.0–10.5)
nRBC: 0 % (ref 0.0–0.2)

## 2019-07-29 LAB — BASIC METABOLIC PANEL
Anion gap: 10 (ref 5–15)
BUN: 14 mg/dL (ref 6–20)
CO2: 26 mmol/L (ref 22–32)
Calcium: 9.2 mg/dL (ref 8.9–10.3)
Chloride: 101 mmol/L (ref 98–111)
Creatinine, Ser: 0.92 mg/dL (ref 0.61–1.24)
GFR calc Af Amer: >60 mL/min (ref >60)
GFR calc non Af Amer: >60 mL/min (ref >60)
Glucose, Bld: 109 mg/dL — ABNORMAL HIGH (ref 70–99)
Potassium: 3.9 mmol/L (ref 3.5–5.1)
Sodium: 137 mmol/L (ref 135–145)

## 2019-07-29 LAB — TROPONIN I (HIGH SENSITIVITY): Troponin I (High Sensitivity): 3 ng/L (ref <18)

## 2019-07-29 MED ORDER — MELOXICAM 7.5 MG PO TABS: 7.5000 mg | ORAL_TABLET | Freq: Two times a day (BID) | ORAL | 0 refills | Status: DC

## 2019-07-29 NOTE — ED Notes (Signed)
Pt ambulatory to bathroom independently

## 2019-07-29 NOTE — ED Provider Notes (Addendum)
[facility] Emergency Department Provider Note  Time seen: 1:40 PM  I have reviewed the triage vital signs and the nursing notes.   HISTORY  Chief Complaint Chest Pain   HPI Hart Hart is a 34 y.o. male with no significant past medical history presents to the emergency department for chest pain.  According to the patient for the past 5 days or so he has been experiencing chest pain in the center of his chest, described as a pulling feeling worse with movement especially twisting movements or pushing on the chest.  Patient denies any cough or shortness of breath.  Denies any fever or abdominal pain.  Patient denies any leg pain or swelling.   Past Medical History:  Diagnosis Date  . Epilepsy [facility])    as a child    Patient Active Problem List   Diagnosis Date Noted  . S/P cervical spinal fusion 08/19/2018    Past Surgical History:  Procedure Laterality Date  . ANTERIOR CERVICAL DECOMP/DISCECTOMY FUSION N/A 08/19/2018   Procedure: ANTERIOR CERVICAL DECOMPRESSION/DISCECTOMY FUSION 2 LEVEL;  Surgeon: Chris Chris, MD;  Location: [facility] ORS;  Service: Neurosurgery;  Laterality: N/A;  . WISDOM TOOTH EXTRACTION      Prior to Admission medications   Medication Sig Start Date End Date Taking? Authorizing Provider  acetaminophen (TYLENOL) 500 MG tablet Take 1,000 mg by mouth every 6 (six) hours as needed for mild pain or moderate pain.    [provider]  methocarbamol (ROBAXIN) 500 MG tablet Take 1 tablet (500 mg total) by mouth 4 (four) times daily. 08/20/18   Ivar Drape, PA-C  oxyCODONE (OXY IR/ROXICODONE) 5 MG immediate release tablet Take 1 tablet (5 mg total) by mouth every 4 (four) hours as needed for moderate pain ((score 4 to 6)). 08/20/18   Ivar Drape, PA-C  promethazine-codeine (PHENERGAN WITH CODEINE) 6.25-10 MG/5ML syrup Take 5 mLs by mouth every 6 (six) hours as needed for cough. 07/21/18   Chinita Pester, FNP    Allergies   Allergen Reactions  . Bactrim [Sulfamethoxazole-Trimethoprim] Itching and Rash    History reviewed. No pertinent family history.  Social History Social History   Tobacco Use  . Smoking status: Never Smoker  . Smokeless tobacco: Never Used  Substance Use Topics  . Alcohol use: Yes    Comment: rarely  . Drug use: No    Review of Systems Constitutional: Negative for fever. Cardiovascular: Intermittent central chest discomfort, mild pulling sensation. Respiratory: Negative for shortness of breath. Gastrointestinal: Negative for abdominal pain Musculoskeletal: Negative for musculoskeletal complaints Neurological: Negative for headache All other ROS negative  ____________________________________________   PHYSICAL EXAM:  VITAL SIGNS: ED Triage Vitals  Enc Vitals Group     BP 07/29/19 1330 (!) 125/91     Pulse Rate 07/29/19 1330 91     Resp 07/29/19 1330 16     Temp --      Temp src --      SpO2 07/29/19 1330 94 %     Weight 07/29/19 1134 300 lb (136.1 kg)     Height 07/29/19 1134 6\' 1"  (1.854 m)     Head Circumference --      Peak Flow --      Pain Score 07/29/19 1134 7     Pain Loc --      Pain Edu? --      Excl. in GC? --    Constitutional: Alert and oriented. Well appearing and in no distress. Eyes:  Normal exam ENT      Head: Normocephalic and atraumatic.      Mouth/Throat: Mucous membranes are moist. Cardiovascular: Normal rate, regular rhythm.  Respiratory: Normal respiratory effort without tachypnea nor retractions. Breath sounds are clear.  Sternum is quite tender to palpation. Gastrointestinal: Soft and nontender. No distention.  Musculoskeletal: Nontender with normal range of motion in all extremities.  Neurologic:  Normal speech and language. No gross focal neurologic deficits Skin:  Skin is warm, dry and intact.  Psychiatric: Mood and affect are normal.    ____________________________________________    EKG  EKG viewed and interpreted by  myself shows a normal sinus rhythm at 90 bpm with a narrow QRS, normal axis, normal intervals, no concerning ST changes.  ____________________________________________    RADIOLOGY  Chest x-ray is clear  ____________________________________________   INITIAL IMPRESSION / ASSESSMENT AND PLAN / ED COURSE  Pertinent labs & imaging results that were available during my care of the patient were reviewed by me and considered in my medical decision making (see chart for details).   Patient presents to the emergency department intermittent chest discomfort described as a pulling sensation over the past 5 days.  Reassuringly patient's labs are normal including negative troponin.  Chest x-ray is normal EKG is reassuring.  Patient's discomfort is very reproducible on exam with sternum palpation.  Highly suspect musculoskeletal pain or costochondritis to be the cause of the patient's symptoms.  We will place the patient on NSAIDs for the next 5 days, I did discuss my typical chest pain return precautions with the patient.  Patient agreeable to plan of care.  Hart Hart was evaluated in Emergency Department on 07/29/2019 for the symptoms described in the history of present illness. He was evaluated in the context of the global COVID-19 pandemic, which necessitated consideration that the patient might be at risk for infection with the SARS-CoV-2 virus that causes COVID-19. Institutional protocols and algorithms that pertain to the evaluation of patients at risk for COVID-19 are in a state of rapid change based on information released by regulatory bodies including the CDC and federal and state organizations. These policies and algorithms were followed during the patient's care in the ED.  ____________________________________________   FINAL CLINICAL IMPRESSION(S) / ED DIAGNOSES  Chest wall pain   Harvest Dark, MD 07/29/19 1342    Harvest Dark, MD 07/29/19 1344

## 2019-07-29 NOTE — ED Triage Notes (Signed)
Pt c/o new constant central chest "pulling" that started Friday/Saturday. A&O x4. Denies radiation. Reports lightheadedness today

## 2019-07-29 NOTE — ED Notes (Signed)
EDP, Paduchowski to bedside. 

## 2019-08-06 ENCOUNTER — Ambulatory Visit: Payer: Self-pay | Attending: Internal Medicine

## 2019-08-06 DIAGNOSIS — Z20822 Contact with and (suspected) exposure to covid-19: Secondary | ICD-10-CM | POA: Insufficient documentation

## 2019-08-07 LAB — NOVEL CORONAVIRUS, NAA: SARS-CoV-2, NAA: NOT DETECTED

## 2019-08-29 ENCOUNTER — Encounter: Payer: Self-pay | Admitting: Emergency Medicine

## 2019-08-29 ENCOUNTER — Other Ambulatory Visit: Payer: Self-pay

## 2019-08-29 ENCOUNTER — Emergency Department: Payer: Self-pay

## 2019-08-29 ENCOUNTER — Emergency Department
Admission: EM | Admit: 2019-08-29 | Discharge: 2019-08-29 | Disposition: A | Discharge status: Home / Self Care | Payer: Self-pay | Attending: Emergency Medicine | Admitting: Emergency Medicine

## 2019-08-29 DIAGNOSIS — G629 Polyneuropathy, unspecified: Secondary | ICD-10-CM | POA: Insufficient documentation

## 2019-08-29 LAB — COMPREHENSIVE METABOLIC PANEL
ALT: 20 U/L (ref 0–44)
AST: 33 U/L (ref 15–41)
Albumin: 3.8 g/dL (ref 3.5–5.0)
Alkaline Phosphatase: 61 U/L (ref 38–126)
Anion gap: 10 (ref 5–15)
BUN: 14 mg/dL (ref 6–20)
CO2: 22 mmol/L (ref 22–32)
Calcium: 8.8 mg/dL — ABNORMAL LOW (ref 8.9–10.3)
Chloride: 106 mmol/L (ref 98–111)
Creatinine, Ser: 0.83 mg/dL (ref 0.61–1.24)
GFR calc Af Amer: >60 mL/min (ref >60)
GFR calc non Af Amer: >60 mL/min (ref >60)
Glucose, Bld: 104 mg/dL — ABNORMAL HIGH (ref 70–99)
Potassium: 4.3 mmol/L (ref 3.5–5.1)
Sodium: 138 mmol/L (ref 135–145)
Total Bilirubin: 1.9 mg/dL — ABNORMAL HIGH (ref 0.3–1.2)
Total Protein: 7.2 g/dL (ref 6.5–8.1)

## 2019-08-29 LAB — CBC
HCT: 40.7 % (ref 39.0–52.0)
Hemoglobin: 13.6 g/dL (ref 13.0–17.0)
MCH: 30.4 pg (ref 26.0–34.0)
MCHC: 33.4 g/dL (ref 30.0–36.0)
MCV: 90.8 fL (ref 80.0–100.0)
Platelets: 258 10*3/uL (ref 150–400)
RBC: 4.48 MIL/uL (ref 4.22–5.81)
RDW: 12.8 % (ref 11.5–15.5)
WBC: 7.7 10*3/uL (ref 4.0–10.5)
nRBC: 0 % (ref 0.0–0.2)

## 2019-08-29 NOTE — ED Triage Notes (Addendum)
Patient ambulatory to triage with steady gait, without difficulty or distress noted, mask in place; pt reports numbness to rt side of face since Wednesday; denies any accomp symptoms; pt A&Ox3, MAEW, smile symmetrical

## 2019-08-29 NOTE — ED Notes (Signed)
PT states that Wednesday he began to experience right sided facial numbness. Patient states that when he woke up he this AM he was experiencing right arm numbness. Patient has decreased sensations in right face and arm. Droop on right side noted and facial tone is decreased on same. Patient is A&Ox4 and ambulatory. Denies difficulty ambulating. Speech is clear. Call light in reach, pt resting in bed

## 2019-08-29 NOTE — ED Provider Notes (Signed)
[facility] Emergency Department Provider Note   ____________________________________________    I have reviewed the triage vital signs and the nursing notes.   HISTORY  Chief Complaint Numbness to the face    HPI Chris Hart is a 34 y.o. male who presents with complaints of numbness in the right side of his face which started several days ago.  Patient reports that he developed a numbness sensation as if he was at the dentist in his right cheek right upper and lower lip (only half).  He reports his tongue feels strange on the right as well.  He also reports that this morning he woke up and his right arm was tingling.  He denies headache.  He has never had this before.  No Covid symptoms, was diagnosed with Covid in November.  No nausea or vomiting.  No head injury.  No trauma.  No weakness or other concerns.  Past Medical History:  Diagnosis Date  . Epilepsy [facility])    as a child    Patient Active Problem List   Diagnosis Date Noted  . S/P cervical spinal fusion 08/19/2018    Past Surgical History:  Procedure Laterality Date  . ANTERIOR CERVICAL DECOMP/DISCECTOMY FUSION N/A 08/19/2018   Procedure: ANTERIOR CERVICAL DECOMPRESSION/DISCECTOMY FUSION 2 LEVEL;  Surgeon: Lucy Chris, MD;  Location: [facility] ORS;  Service: Neurosurgery;  Laterality: N/A;  . WISDOM TOOTH EXTRACTION      Prior to Admission medications   Medication Sig Start Date End Date Taking? Authorizing Provider  acetaminophen (TYLENOL) 500 MG tablet Take 1,000 mg by mouth every 6 (six) hours as needed for mild pain or moderate pain.    [provider]  meloxicam (MOBIC) 7.5 MG tablet Take 1 tablet (7.5 mg total) by mouth 2 (two) times daily. 07/29/19 07/28/20  Minna Antis, MD  methocarbamol (ROBAXIN) 500 MG tablet Take 1 tablet (500 mg total) by mouth 4 (four) times daily. 08/20/18   Ivar Drape, PA-C  oxyCODONE (OXY IR/ROXICODONE) 5 MG immediate release tablet Take 1  tablet (5 mg total) by mouth every 4 (four) hours as needed for moderate pain ((score 4 to 6)). 08/20/18   Ivar Drape, PA-C  promethazine-codeine (PHENERGAN WITH CODEINE) 6.25-10 MG/5ML syrup Take 5 mLs by mouth every 6 (six) hours as needed for cough. 07/21/18   Chinita Pester, FNP     Allergies Bactrim [sulfamethoxazole-trimethoprim]  No family history on file.  Social History Social History   Tobacco Use  . Smoking status: Never Smoker  . Smokeless tobacco: Never Used  Substance Use Topics  . Alcohol use: Yes    Comment: rarely  . Drug use: No    Review of Systems  Constitutional: No fever/chills Eyes: No visual changes.  ENT: No neck pain Cardiovascular: Denies chest pain. Respiratory: Denies cough Gastrointestinal:  No nausea, no vomiting.   Genitourinary: Negative for difficulty urinating Musculoskeletal: Negative for weakness Skin: Negative for rash. Neurological: As above   ____________________________________________   PHYSICAL EXAM:  VITAL SIGNS: ED Triage Vitals  Enc Vitals Group     BP 08/29/19 0646 131/82     Pulse Rate 08/29/19 0646 76     Resp 08/29/19 0646 18     Temp 08/29/19 0646 97.9 F (36.6 C)     Temp Source 08/29/19 0646 Oral     SpO2 08/29/19 0646 98 %     Weight 08/29/19 0644 136.1 kg (300 lb)     Height 08/29/19 0644 1.829 m (  6')     Head Circumference --      Peak Flow --      Pain Score 08/29/19 0644 0     Pain Loc --      Pain Edu? --      Excl. in [place]? --     Constitutional: Alert and oriented.  Eyes: Conjunctivae are normal.  PERRLA, EOMI Head: Atraumatic. Nose: No congestion/rhinnorhea. Mouth/Throat: Mucous membranes are moist.   Neck:  Painless ROM, no vertebral tenderness palpation Cardiovascular: Normal rate, regular rhythm. Grossly normal heart sounds.  Good peripheral circulation. Respiratory: Normal respiratory effort.  No retractions. Lungs CTAB. Gastrointestinal: Soft and nontender. No distention.  No CVA  tenderness. Genitourinary: deferred Musculoskeletal: No lower extremity tenderness nor edema.  Warm and well perfused, normal strength in all extremities Neurologic:  Normal speech and language. No gross focal neurologic deficits are appreciated.  Cranial nerves II through XII motor function normal Skin:  Skin is warm, dry and intact.  Right cheek appears possibly erythematous although no clear herpetic rash or abnormality Psychiatric: Mood and affect are normal. Speech and behavior are normal.  ____________________________________________   LABS (all labs ordered are listed, but only abnormal results are displayed)  Labs Reviewed  COMPREHENSIVE METABOLIC PANEL - Abnormal; Notable for the following components:      Result Value   Glucose, Bld 104 (*)    Calcium 8.8 (*)    Total Bilirubin 1.9 (*)    All other components within normal limits  CBC   ____________________________________________  EKG  ED ECG REPORT I, Lavonia Drafts, the attending physician, personally viewed and interpreted this ECG.  Date: 08/29/2019  Rhythm: normal sinus rhythm QRS Axis: normal Intervals: normal ST/T Wave abnormalities: normal Narrative Interpretation: no evidence of acute ischemia  ____________________________________________  RADIOLOGY  MRI brain negative ____________________________________________   PROCEDURES  Procedure(s) performed: No  Procedures   Critical Care performed: No ____________________________________________   INITIAL IMPRESSION / ASSESSMENT AND PLAN / ED COURSE  Pertinent labs & imaging results that were available during my care of the patient were reviewed by me and considered in my medical decision making (see chart for details).  Patient presents with right facial sensory dysfunction and now with right arm tingling.  No motor weakness noted.  No facial droop.  Questionable erythema to the right cheek although this appears equal to the left cheek.   Considered shingles but no herpetic rash.  MRI ordered to rule out CVA  MRI is normal.  Lab work is unremarkable.  Patient will need outpatient follow-up to evaluate further, return precautions discussed    ____________________________________________   FINAL CLINICAL IMPRESSION(S) / ED DIAGNOSES  Final diagnoses:  Neuropathy        Note:  This document was prepared using Dragon voice recognition software and may include unintentional dictation errors.   Lavonia Drafts, MD 08/29/19 [phone]

## 2019-08-30 ENCOUNTER — Emergency Department
Admission: EM | Admit: 2019-08-30 | Discharge: 2019-08-30 | Disposition: A | Discharge status: Home / Self Care | Payer: Self-pay | Attending: Emergency Medicine | Admitting: Emergency Medicine

## 2019-08-30 ENCOUNTER — Other Ambulatory Visit: Payer: Self-pay

## 2019-08-30 DIAGNOSIS — Z79899 Other long term (current) drug therapy: Secondary | ICD-10-CM | POA: Insufficient documentation

## 2019-08-30 DIAGNOSIS — G510 Bell's palsy: Secondary | ICD-10-CM | POA: Insufficient documentation

## 2019-08-30 LAB — CBC WITH DIFFERENTIAL/PLATELET
Abs Immature Granulocytes: 0.04 10*3/uL (ref 0.00–0.07)
Basophils Absolute: 0.1 10*3/uL (ref 0.0–0.1)
Basophils Relative: 1 %
Eosinophils Absolute: 0.3 10*3/uL (ref 0.0–0.5)
Eosinophils Relative: 4 %
HCT: 41.9 % (ref 39.0–52.0)
Hemoglobin: 14.2 g/dL (ref 13.0–17.0)
Immature Granulocytes: 0 %
Lymphocytes Relative: 32 %
Lymphs Abs: 3.1 10*3/uL (ref 0.7–4.0)
MCH: 29.8 pg (ref 26.0–34.0)
MCHC: 33.9 g/dL (ref 30.0–36.0)
MCV: 88 fL (ref 80.0–100.0)
Monocytes Absolute: 0.5 10*3/uL (ref 0.1–1.0)
Monocytes Relative: 6 %
Neutro Abs: 5.6 10*3/uL (ref 1.7–7.7)
Neutrophils Relative %: 57 %
Platelets: 291 10*3/uL (ref 150–400)
RBC: 4.76 MIL/uL (ref 4.22–5.81)
RDW: 12.5 % (ref 11.5–15.5)
WBC: 9.6 10*3/uL (ref 4.0–10.5)
nRBC: 0 % (ref 0.0–0.2)

## 2019-08-30 LAB — COMPREHENSIVE METABOLIC PANEL
ALT: 34 U/L (ref 0–44)
AST: 31 U/L (ref 15–41)
Albumin: 4.1 g/dL (ref 3.5–5.0)
Alkaline Phosphatase: 67 U/L (ref 38–126)
Anion gap: 8 (ref 5–15)
BUN: 11 mg/dL (ref 6–20)
CO2: 25 mmol/L (ref 22–32)
Calcium: 8.9 mg/dL (ref 8.9–10.3)
Chloride: 103 mmol/L (ref 98–111)
Creatinine, Ser: 0.87 mg/dL (ref 0.61–1.24)
GFR calc Af Amer: >60 mL/min (ref >60)
GFR calc non Af Amer: >60 mL/min (ref >60)
Glucose, Bld: 142 mg/dL — ABNORMAL HIGH (ref 70–99)
Potassium: 3.1 mmol/L — ABNORMAL LOW (ref 3.5–5.1)
Sodium: 136 mmol/L (ref 135–145)
Total Bilirubin: 0.9 mg/dL (ref 0.3–1.2)
Total Protein: 7.9 g/dL (ref 6.5–8.1)

## 2019-08-30 MED ORDER — VALACYCLOVIR HCL 1 G PO TABS: 1000.0000 mg | ORAL_TABLET | Freq: Three times a day (TID) | ORAL | 0 refills | Status: AC

## 2019-08-30 MED ORDER — PROCHLORPERAZINE MALEATE 10 MG PO TABS: 10.0000 mg | ORAL_TABLET | Freq: Three times a day (TID) | ORAL | 0 refills | Status: DC | PRN

## 2019-08-30 MED ORDER — POTASSIUM CHLORIDE CRYS ER 20 MEQ PO TBCR
40.0000 meq | EXTENDED_RELEASE_TABLET | Freq: Once | ORAL | Status: AC
  Administered 2019-08-30: 40 meq via ORAL
  Filled 2019-08-30: qty 2

## 2019-08-30 MED ORDER — SODIUM CHLORIDE 0.9 % IV BOLUS
1000.0000 mL | Freq: Once | INTRAVENOUS | Status: AC
  Administered 2019-08-30: 1000 mL via INTRAVENOUS

## 2019-08-30 MED ORDER — PREDNISONE 20 MG PO TABS: 60.0000 mg | ORAL_TABLET | Freq: Every day | ORAL | 0 refills | Status: AC

## 2019-08-30 MED ORDER — PROCHLORPERAZINE EDISYLATE 10 MG/2ML IJ SOLN
10.0000 mg | Freq: Once | INTRAMUSCULAR | Status: AC
  Administered 2019-08-30: 20:00:00 10 mg via INTRAVENOUS
  Filled 2019-08-30: qty 2

## 2019-08-30 NOTE — ED Notes (Signed)
First Nurse Note: Pt ambulatory into ED stating that he is having numbness in the right side of his body. Pt states that the right side of his body does not want to work on its own. Pt states that he cannot close his "left" on its own. Pt ambulatory with a steady gait without difficulty. Pt had negative MRI yesterday.

## 2019-08-30 NOTE — Discharge Instructions (Addendum)
As we discussed please be sure to protect your eye. You can tape it shut at night and by artificial tear drops at a pharmacy to help keep it lubricated. Please seek medical attention for any high fevers, chest pain, shortness of breath, change in behavior, persistent vomiting, bloody stool or any other new or concerning symptoms.

## 2019-08-30 NOTE — ED Notes (Signed)
Spoke with dr. Derrill Kay regarding pt's symptoms. No orders received for repeat imaging, but orders for repeat blood work received.

## 2019-08-30 NOTE — ED Provider Notes (Signed)
[facility] Emergency Department Provider Note  ____________________________________________   I have reviewed the triage vital signs and the nursing notes.   HISTORY  Chief Complaint right facial numbness   History limited by: Not Limited   HPI Chris Hart is a 34 y.o. male who presents to the emergency department today because of concerns for worsening right facial numbness and weakness.  The patient was seen in the emergency department yesterday for right facial numbness.  Had a negative MRI done at that time.  Since that ER visit his symptoms have gotten worse.  He states he can barely close his right eye at this time.  His numbness and right facial weakness is accompanied by some right sided headache.  Patient denies similar symptoms in the past.  Denies any recent head trauma.  Records reviewed. Per medical record review patient has a history of visit to the ER yesterday for the same symptoms. Had negative MRI performed at that time.   Past Medical History:  Diagnosis Date  . Epilepsy [facility])    as a child    Patient Active Problem List   Diagnosis Date Noted  . S/P cervical spinal fusion 08/19/2018    Past Surgical History:  Procedure Laterality Date  . ANTERIOR CERVICAL DECOMP/DISCECTOMY FUSION N/A 08/19/2018   Procedure: ANTERIOR CERVICAL DECOMPRESSION/DISCECTOMY FUSION 2 LEVEL;  Surgeon: Lucy Chris, MD;  Location: [facility] ORS;  Service: Neurosurgery;  Laterality: N/A;  . WISDOM TOOTH EXTRACTION      Prior to Admission medications   Medication Sig Start Date End Date Taking? Authorizing Provider  acetaminophen (TYLENOL) 500 MG tablet Take 1,000 mg by mouth every 6 (six) hours as needed for mild pain or moderate pain.    [provider]  meloxicam (MOBIC) 7.5 MG tablet Take 1 tablet (7.5 mg total) by mouth 2 (two) times daily. 07/29/19 07/28/20  Minna Antis, MD  methocarbamol (ROBAXIN) 500 MG tablet Take 1 tablet (500 mg total)  by mouth 4 (four) times daily. 08/20/18   Ivar Drape, PA-C  oxyCODONE (OXY IR/ROXICODONE) 5 MG immediate release tablet Take 1 tablet (5 mg total) by mouth every 4 (four) hours as needed for moderate pain ((score 4 to 6)). 08/20/18   Ivar Drape, PA-C  promethazine-codeine (PHENERGAN WITH CODEINE) 6.25-10 MG/5ML syrup Take 5 mLs by mouth every 6 (six) hours as needed for cough. 07/21/18   Chinita Pester, FNP    Allergies Bactrim [sulfamethoxazole-trimethoprim]  No family history on file.  Social History Social History   Tobacco Use  . Smoking status: Never Smoker  . Smokeless tobacco: Never Used  Substance Use Topics  . Alcohol use: Yes    Comment: rarely  . Drug use: No    Review of Systems Constitutional: No fever/chills Eyes: No visual changes. ENT: No sore throat. Cardiovascular: Denies chest pain. Respiratory: Denies shortness of breath. Gastrointestinal: No abdominal pain.  No nausea, no vomiting.  No diarrhea.   Genitourinary: Negative for dysuria. Musculoskeletal: Negative for back pain. Skin: Negative for rash. Neurological: Positive for right sided headache. Right facial numbness.  ____________________________________________   PHYSICAL EXAM:  VITAL SIGNS: ED Triage Vitals  Enc Vitals Group     BP 08/30/19 1911 (!) 141/82     Pulse Rate 08/30/19 1911 87     Resp 08/30/19 1911 14     Temp 08/30/19 1911 98.9 F (37.2 C)     Temp Source 08/30/19 1911 Oral     SpO2 08/30/19 1911 96 %  Weight 08/30/19 1912 300 lb (136.1 kg)     Height 08/30/19 1912 6' (1.829 m)     Head Circumference --      Peak Flow --      Pain Score 08/30/19 1912 8   Constitutional: Alert and oriented.  Eyes: Conjunctivae are normal.  ENT      Head: Normocephalic and atraumatic. Right TM without abnormality.      Nose: No congestion/rhinnorhea.      Mouth/Throat: Mucous membranes are moist.      Neck: No stridor. Hematological/Lymphatic/Immunilogical: No cervical  lymphadenopathy. Cardiovascular: Normal rate, regular rhythm.  No murmurs, rubs, or gallops.  Respiratory: Normal respiratory effort without tachypnea nor retractions. Breath sounds are clear and equal bilaterally. No wheezes/rales/rhonchi. Gastrointestinal: Soft and non tender. No rebound. No guarding.  Genitourinary: Deferred Musculoskeletal: Normal range of motion in all extremities. No lower extremity edema. Neurologic:  Right facial droop. Unable to elevate right eyebrow.  Skin:  Skin is warm, dry and intact. No rash noted. Psychiatric: Mood and affect are normal. Speech and behavior are normal. Patient exhibits appropriate insight and judgment.  ____________________________________________    LABS (pertinent positives/negatives)  CBC wbc 9.6, hgb 14.2, ptl 291 CMP wnl except k 3.1, glu 142  ____________________________________________   EKG  None  ____________________________________________    RADIOLOGY  None  ____________________________________________   PROCEDURES  Procedures  ____________________________________________   INITIAL IMPRESSION / ASSESSMENT AND PLAN / ED COURSE  Pertinent labs & imaging results that were available during my care of the patient were reviewed by me and considered in my medical decision making (see chart for details).  Patient presents because of concern for worsening right facial numbness and weakness. Had negative MRI performed yesterday. Clinical exam is consistent with bell's palsy. Also complaining of some headache. Headache did improve with compazine. Discussed bell's palsy with patient. Discussed eye care. Will discharge with prescription for steroid and antiviral. Will give neurology follow up.  ____________________________________________   FINAL CLINICAL IMPRESSION(S) / ED DIAGNOSES  Final diagnoses:  Bell's palsy     Note: This dictation was prepared with Dragon dictation. Any transcriptional errors that result  from this process are unintentional     Nance Pear, MD 08/30/19 2100

## 2019-08-30 NOTE — ED Triage Notes (Signed)
Pt states has had right sided facial numbness and since Friday morning. Pt states was seen here yesterday for same and received and MRI. Pt states since around 2000 last night has had right sided facial "like my face doesn't want to work". Pt states has soreness on right side of jaw and headache on right side. Pt with ptosis noted to right eye and drooping of right mouth noted. No deviation to tongue noted.

## 2019-09-08 ENCOUNTER — Emergency Department
Admission: EM | Admit: 2019-09-08 | Discharge: 2019-09-08 | Disposition: A | Discharge status: Home / Self Care | Payer: Self-pay | Attending: Emergency Medicine | Admitting: Emergency Medicine

## 2019-09-08 ENCOUNTER — Encounter: Payer: Self-pay | Admitting: Emergency Medicine

## 2019-09-08 ENCOUNTER — Other Ambulatory Visit: Payer: Self-pay

## 2019-09-08 DIAGNOSIS — G510 Bell's palsy: Secondary | ICD-10-CM | POA: Insufficient documentation

## 2019-09-08 DIAGNOSIS — R519 Headache, unspecified: Secondary | ICD-10-CM

## 2019-09-08 MED ORDER — OXYCODONE-ACETAMINOPHEN 5-325 MG PO TABS: 1.0000 | ORAL_TABLET | Freq: Four times a day (QID) | ORAL | 0 refills | Status: DC | PRN

## 2019-09-08 MED ORDER — OXYCODONE-ACETAMINOPHEN 5-325 MG PO TABS
1.0000 | ORAL_TABLET | Freq: Once | ORAL | Status: AC
  Administered 2019-09-08: 1 via ORAL
  Filled 2019-09-08: qty 1

## 2019-09-08 NOTE — ED Notes (Signed)
See triage note  Presents with right sided headache  States he was seen and dx'd with Bell's Palsy    Describes pain as tingling to right side of face

## 2019-09-08 NOTE — Discharge Instructions (Signed)
Keep your appointment with Dr. Malvin Johns today.  A prescription for Percocet was sent to your pharmacy.  This is to be taken and as needed for pain every 6 hours.  Also Dr. Druscilla Brownie is the ophthalmologist on-call for [facility].  You may call and make an appointment for further evaluation of your vision.

## 2019-09-08 NOTE — ED Triage Notes (Signed)
Pt arrives POV to triage with c/o right sided facial tingling and HA. Pt is talking in complete sentences and in NAD.

## 2019-09-08 NOTE — ED Provider Notes (Signed)
[facility] Emergency Department Provider Note  ____________________________________________   First MD Initiated Contact with Patient 09/08/19 [phone]     (approximate)  I have reviewed the triage vital signs and the nursing notes.   HISTORY  Chief Complaint Headache   HPI Scotti Kosta is a 34 y.o. male presents to the ED with complaint of right-sided headache for several days.  Patient states that he was diagnosed with Bell's palsy in the emergency department on 08/30/2019.  Patient states that he has completed most of the medication with the exception of Valtrex.  He continues to have numbness to the right side of his face along with difficulty putting his eyelids completely together.  Patient denies any nausea, vomiting, fever or chills.  He denies any known exposure to Covid.  He currently does not have an appointment with neurology for follow-up.  Today he also complains of some visual changes in the right eye.  He states that it looks like a smudge on his classes however it is still there when he takes his glasses off.  He states that the right sided headache and scalp pain is so uncomfortable that he it is difficult for him to sleep.  He rates his pain as a 9 out of 10.     Past Medical History:  Diagnosis Date  . Epilepsy [facility])    as a child    Patient Active Problem List   Diagnosis Date Noted  . S/P cervical spinal fusion 08/19/2018    Past Surgical History:  Procedure Laterality Date  . ANTERIOR CERVICAL DECOMP/DISCECTOMY FUSION N/A 08/19/2018   Procedure: ANTERIOR CERVICAL DECOMPRESSION/DISCECTOMY FUSION 2 LEVEL;  Surgeon: Lucy Chris, MD;  Location: [facility] ORS;  Service: Neurosurgery;  Laterality: N/A;  . WISDOM TOOTH EXTRACTION      Prior to Admission medications   Medication Sig Start Date End Date Taking? Authorizing Provider  acetaminophen (TYLENOL) 500 MG tablet Take 1,000 mg by mouth every 6 (six) hours as needed for mild pain or  moderate pain.    [provider]  oxyCODONE-acetaminophen (PERCOCET) 5-325 MG tablet Take 1 tablet by mouth every 6 (six) hours as needed for moderate pain. 09/08/19   Tommi Rumps, PA-C  prochlorperazine (COMPAZINE) 10 MG tablet Take 1 tablet (10 mg total) by mouth every 8 (eight) hours as needed (headache). 08/30/19   Phineas Semen, MD    Allergies Bactrim [sulfamethoxazole-trimethoprim]  History reviewed. No pertinent family history.  Social History Social History   Tobacco Use  . Smoking status: Never Smoker  . Smokeless tobacco: Never Used  Substance Use Topics  . Alcohol use: Yes    Comment: rarely  . Drug use: No    Review of Systems Constitutional: No fever/chills Eyes: Complain of right eye visual changes. ENT: No sore throat. Cardiovascular: Denies chest pain. Respiratory: Denies shortness of breath. Gastrointestinal: No abdominal pain.  No nausea, no vomiting.  No diarrhea. Genitourinary: Negative for dysuria. Musculoskeletal: Negative for back pain. Skin: Negative for rash. Neurological: Positive for headaches, positive right sided facial numbness. ____________________________________________   PHYSICAL EXAM:  VITAL SIGNS: ED Triage Vitals  Enc Vitals Group     BP 09/08/19 0705 126/78     Pulse Rate 09/08/19 0705 88     Resp 09/08/19 0705 18     Temp 09/08/19 0705 98.2 F (36.8 C)     Temp Source 09/08/19 0705 Oral     SpO2 09/08/19 0705 98 %     Weight  09/08/19 0703 300 lb (136.1 kg)     Height 09/08/19 0703 6' (1.829 m)     Head Circumference --      Peak Flow --      Pain Score 09/08/19 0703 9     Pain Loc --      Pain Edu? --      Excl. in [place]? --     Constitutional: Alert and oriented. Well appearing and in no acute distress. Eyes: Conjunctivae are normal.  Head: Atraumatic. Nose: No congestion/rhinnorhea. Neck: No stridor.   Cardiovascular: Normal rate, regular rhythm. Grossly normal heart sounds.  Good peripheral  circulation. Respiratory: Normal respiratory effort.  No retractions. Lungs CTAB. Gastrointestinal: Soft and nontender. No distention. Musculoskeletal: No lower extremity tenderness nor edema.  No joint effusions. Neurologic:  Normal speech and language. No gross focal neurologic deficits are appreciated.  Patient continues to have right-sided facial involvement with asymmetrical smile and decreased ability to close the upper and lower eyelid completely.  No gait instability. Skin:  Skin is warm, dry and intact.  No rashes noted on the scalp, neck or anterior chest. Psychiatric: Mood and affect are normal. Speech and behavior are normal.  ____________________________________________   LABS (all labs ordered are listed, but only abnormal results are displayed)  Labs Reviewed - No data to display ____________________________________________  PROCEDURES  Procedure(s) performed (including Critical Care):  Procedures ____________________________________________   INITIAL IMPRESSION / ASSESSMENT AND PLAN / ED COURSE  As part of my medical decision making, I reviewed the following data within the electronic MEDICAL RECORD NUMBER Notes from prior ED visits and  Controlled Substance Database   34 year old male presents to the ED with complaint of headache after being diagnosed with Bell's palsy earlier in the month.  Patient states that he has nearly completed all of the medication has been prescribed for him including the prednisone, Compazine, Valtrex.  Patient has been unable to get his headache under control.  Previous ED visits were reviewed along with report from MRI brain imaging.  While in the ED the wife called the husband and states that he has an appointment with the neurologist at [facility] today.  Patient was given Percocet while in the ED and states that his headache is tolerable at this point.  He is strongly encouraged to keep this appointment with Dr. Melrose Nakayama and also  information was given about the [facility] should he need to have his vision and glasses checked.  He states it has been several years since he has had new glasses.  He is to return to the emergency department if any severe worsening of his symptoms or urgent concerns.  ____________________________________________   FINAL CLINICAL IMPRESSION(S) / ED DIAGNOSES  Final diagnoses:  Acute nonintractable headache, unspecified headache type  Bell's palsy     ED Discharge Orders         Ordered    oxyCODONE-acetaminophen (PERCOCET) 5-325 MG tablet  Every 6 hours PRN     09/08/19 0929           Note:  This document was prepared using Dragon voice recognition software and may include unintentional dictation errors.    Johnn Hai, PA-C 09/08/19 1356    Earleen Newport, MD 09/08/19 1435

## 2020-12-06 ENCOUNTER — Emergency Department
Admission: EM | Admit: 2020-12-06 | Discharge: 2020-12-06 | Disposition: A | Discharge status: Home / Self Care | Payer: Self-pay | Attending: Emergency Medicine | Admitting: Emergency Medicine

## 2020-12-06 ENCOUNTER — Encounter: Payer: Self-pay | Admitting: Emergency Medicine

## 2020-12-06 ENCOUNTER — Other Ambulatory Visit: Payer: Self-pay

## 2020-12-06 DIAGNOSIS — K029 Dental caries, unspecified: Secondary | ICD-10-CM | POA: Insufficient documentation

## 2020-12-06 MED ORDER — LIDOCAINE-EPINEPHRINE 2 %-1:100000 IJ SOLN
1.7000 mL | Freq: Once | INTRAMUSCULAR | Status: DC
  Filled 2020-12-06: qty 1.7

## 2020-12-06 MED ORDER — LIDOCAINE VISCOUS HCL 2 % MT SOLN: 15.0000 mL | OROMUCOSAL | 0 refills | Status: DC | PRN

## 2020-12-06 MED ORDER — LIDOCAINE VISCOUS HCL 2 % MT SOLN
15.0000 mL | Freq: Once | OROMUCOSAL | Status: AC
  Administered 2020-12-06: 15 mL via OROMUCOSAL
  Filled 2020-12-06: qty 15

## 2020-12-06 MED ORDER — AMOXICILLIN 500 MG PO CAPS: 500.0000 mg | ORAL_CAPSULE | Freq: Three times a day (TID) | ORAL | 0 refills | Status: DC

## 2020-12-06 NOTE — ED Triage Notes (Signed)
Left sided dental pain intermittently 'for a while'.

## 2020-12-06 NOTE — Discharge Instructions (Addendum)
OPTIONS FOR DENTAL FOLLOW UP CARE ° °[place] Department [employer] Health and Human Services - Local Safety Net Dental Clinics °http://www.ncdhhs.gov/dph/oralhealth/services/safetynetclinics.htm °  °[facility] (336-562-3123) ° °[facility] [place] (919-933-9087) ° °[facility] (919-663-1744 ext [phone]) ° °[facility] ([phone]) ° °[facility] ([phone]) °This clinic caters to the indigent population and is on a lottery system. °Location: °[facility], [address], [place] °Clinic Hours: °Wednesdays from 6pm - 9pm, patients seen by a lottery system. °For dates, call or go to [email or web address].[facility].edu/[facility]/patients/Dental-[facility] °Services: °Cleanings, fillings and simple extractions. °Payment Options: °DENTAL WORK IS FREE OF CHARGE. Bring proof of income or support. °Best way to get seen: °Arrive at 5:15 pm - this is a lottery, NOT first come/first serve, so arriving earlier will not increase your chances of being seen. °  °  °[facility] °[phone] °Select option 1 for emergencies °  °Location: °[facility], [address], [place] °Clinic Hours: °No walk-ins accepted - call the day before to schedule an appointment. °Check in times are 9:30 am and 1:30 pm. °Services: °Simple extractions, temporary fillings, pulpectomy/pulp debridement, uncomplicated abscess drainage. °Payment Options: °PAYMENT IS DUE AT THE TIME OF SERVICE.  Fee is usually $100-200, additional surgical procedures (e.g. abscess drainage) may be extra. °Cash, checks, Visa/MasterCard accepted.  Can file Medicaid if patient is covered for dental - patient should call case worker to check. °No discount for [facility] patients. °Best way to get seen: °MUST call the day before and get onto the schedule. Can usually be seen the next 1-2 days. No walk-ins accepted. °  °  °[facility] °[phone] °   °Location: °[facility], [address], [place] °Clinic Hours: °M, W, Th, F 8am or 1:30pm, Tues 9a or 1:30 - first come/first served. °Services: °Simple extractions, temporary fillings, uncomplicated abscess drainage.  You do not need to be an [place] resident. °Payment Options: °PAYMENT IS DUE AT THE TIME OF SERVICE. °Dental insurance, otherwise sliding scale - bring proof of income or support. °Depending on income and treatment needed, cost is usually $50-200. °Best way to get seen: °Arrive early as it is first come/first served. °  °  °[facility] °[phone] °  °Location: °[address] °Clinic Hours: °Mon-Thu 8a-5p °Services: °Most basic dental services including extractions and fillings. °Payment Options: °PAYMENT IS DUE AT THE TIME OF SERVICE. °Sliding scale, up to 50% off - bring proof if income or support. °Medicaid with dental option accepted. °Best way to get seen: °Call to schedule an appointment, can usually be seen within 2 weeks OR they will try to see walk-ins - show up at 8a or 2p (you may have to wait). °  °  °[facility] °[phone] °[place] RESIDENTS ONLY °  °Location: °[facility], [address], [place], [place] [address] °Clinic Hours: By appointment only. °Monday - Thursday 8am-5pm, Friday 8am-12pm °Services: Cleanings, fillings, extractions. °Payment Options: °PAYMENT IS DUE AT THE TIME OF SERVICE. °Cash, Visa or MasterCard. Sliding scale - $30 minimum per service. °Best way to get seen: °Come in to office, complete packet and make an appointment - need proof of income °or support monies for each household member and proof of [place] residence. °Usually takes about a month to get in. °  °  °[facility] °[phone] °  °Location: °[address]   [place] °Clinic Hours: Walk-in Urgent Care Dental Services are offered Monday-Friday  mornings only. °The numbers of emergencies accepted daily is limited to the number of °providers available. °Maximum 15 - Mondays, Wednesdays & Thursdays °Maximum 10 - Tuesdays & Fridays °Services: °You do not need to be a [place] resident to be seen for a dental emergency. °Emergencies are defined as pain, swelling, abnormal bleeding, or dental trauma. Walkins will receive x-rays if needed. °NOTE: Dental cleaning is not an emergency. °Payment Options: °PAYMENT IS DUE AT THE TIME OF SERVICE. °Minimum co-pay is $40.00 for uninsured patients. °Minimum co-pay is $3.00 for Medicaid with dental coverage. °Dental Insurance is accepted and must be presented at time of visit. °Medicare does not cover dental. °Forms of payment: Cash, credit card, checks. °Best way to get seen: °If not previously registered with the clinic, walk-in dental registration begins at 7:15 am and is on a first come/first serve basis. °If previously registered with the clinic, call to make an appointment. °  °  °[facility] °[phone] °[place] RESIDENTS ONLY °  °Location: °[address], [place], [place] °Clinic Hours: °Mon-Thu 10a-2p °Services: Extractions only! °Payment Options: °FREE (donations accepted) - bring proof of income or support °Best way to get seen: °Call and schedule an appointment OR come at 8am on the 1st Monday of every month (except for holidays) when it is first come/first served. °  °  °[facility] °[phone] °  °Location: °[address], [place] °Clinic Hours: °Friday mornings °Services, Payment Options, Best way to get seen: °Call for info °

## 2020-12-06 NOTE — ED Notes (Signed)
Patient presents with left upper dental pain today that has been going on for 2 weeks worsening today patient has taken ora jel, ibprofen, tylenol, advil, ambisol without any relief.  No facial trauma or fevers

## 2020-12-06 NOTE — ED Provider Notes (Signed)
[facility] Emergency Department Provider Note ____________________________________________  Time seen: 1354  I have reviewed the triage vital signs and the nursing notes.  HISTORY  Chief Complaint  Dental Pain   HPI Chris Hart is a 35 y.o. male presents himself to the ED for evaluation of dental pain.  He gives a history of left-sided dental pain that has been intermittently present for "a while."  He denies any interim fever, chills, or sweats.  No nausea, vomiting, or swelling of the mouth, lips, or throat reported.  Past Medical History:  Diagnosis Date   Epilepsy [facility])    as a child    Patient Active Problem List   Diagnosis Date Noted   S/P cervical spinal fusion 08/19/2018    Past Surgical History:  Procedure Laterality Date   ANTERIOR CERVICAL DECOMP/DISCECTOMY FUSION N/A 08/19/2018   Procedure: ANTERIOR CERVICAL DECOMPRESSION/DISCECTOMY FUSION 2 LEVEL;  Surgeon: Lucy Chris, MD;  Location: [facility] ORS;  Service: Neurosurgery;  Laterality: N/A;   WISDOM TOOTH EXTRACTION      Prior to Admission medications   Medication Sig Start Date End Date Taking? Authorizing Provider  amoxicillin (AMOXIL) 500 MG capsule Take 1 capsule (500 mg total) by mouth 3 (three) times daily. 12/06/20  Yes Aubrina Nieman, Charlesetta Ivory, PA-C  lidocaine (XYLOCAINE) 2 % solution Use as directed 15 mLs in the mouth or throat every 4 (four) hours as needed for mouth pain. 12/06/20  Yes Tarl Cephas, Charlesetta Ivory, PA-C    Allergies Bactrim [sulfamethoxazole-trimethoprim]  History reviewed. No pertinent family history.  Social History Social History   Tobacco Use   Smoking status: Never   Smokeless tobacco: Never  Vaping Use   Vaping Use: Never used  Substance Use Topics   Alcohol use: Yes    Comment: rarely   Drug use: No    Review of Systems  Constitutional: Negative for fever. Eyes: Negative for visual changes. ENT: Negative for sore throat.  Left-sided  upper dental pain as above. Cardiovascular: Negative for chest pain. Respiratory: Negative for shortness of breath. Gastrointestinal: Negative for abdominal pain, vomiting and diarrhea. Genitourinary: Negative for dysuria. Musculoskeletal: Negative for back pain. Skin: Negative for rash. Neurological: Negative for headaches, focal weakness or numbness. ____________________________________________  PHYSICAL EXAM:  VITAL SIGNS: ED Triage Vitals  Enc Vitals Group     BP 12/06/20 1335 129/79     Pulse Rate 12/06/20 1335 86     Resp 12/06/20 1335 18     Temp 12/06/20 1335 99 F (37.2 C)     Temp Source 12/06/20 1335 Oral     SpO2 12/06/20 1335 97 %     Weight 12/06/20 1328 299 lb 13.2 oz (136 kg)     Height 12/06/20 1328 6' (1.829 m)     Head Circumference --      Peak Flow --      Pain Score 12/06/20 1328 8     Pain Loc --      Pain Edu? --      Excl. in GC? --     Constitutional: Alert and oriented. Well appearing and in no distress. Head: Normocephalic and atraumatic. Eyes: Conjunctivae are normal. PERRL. Normal extraocular movements Ears: Canals clear. TMs intact bilaterally. Nose: No congestion/rhinorrhea/epistaxis. Mouth/Throat: Mucous membranes are moist.  Uvula is midline and tonsils are flat.  No oropharyngeal lesions are appreciated.  No focal gum swelling or erythema is appreciated.  Patient localizes pain to the left upper second premolar and first molar,  as well as the lower first molar.  No brawny sublingual edema is appreciated. Neck: Supple. No thyromegaly. Hematological/Lymphatic/Immunological: No cervical lymphadenopathy. Cardiovascular: Normal rate, regular rhythm. Normal distal pulses. Respiratory: Normal respiratory effort. No wheezes/rales/rhonchi. Musculoskeletal: Nontender with normal range of motion in all extremities.  Neurologic:  Normal gait without ataxia. Normal speech and language. No gross focal neurologic deficits are appreciated. Skin:  Skin is  warm, dry and intact. No rash noted. ____________________________________________  PROCEDURES  Viscous lidocaine gargle  Procedures ____________________________________________   INITIAL IMPRESSION / ASSESSMENT AND PLAN / ED COURSE  As part of my medical decision making, I reviewed the following data within the electronic MEDICAL RECORD NUMBER Notes from prior ED visits and [place] Controlled Substance Database     DDX: dental abscess, dental caries, PTA, tooth sensitivity  Patient ED evaluation of intermittent left dental pain.  Patient localizes pain to left upper and lower jaws.  No acute findings on exam including fluctuance, pointing, or acute dental abscess.  No signs of any sublingual swelling or rigidity.  Exam is overall benign reassurance time patient's may represent an occult dental caries.  Patient is discharged with an empiric course of amoxicillin which he is advised to complete if he should start.  He is also given viscous lidocaine to gargle for topical pain relief.  He is given a list of dental providers which she can choose, for definitive management.  Care & return precautions have been reviewed.  Chris Hart was evaluated in Emergency Department on 12/06/2020 for the symptoms described in the history of present illness. He was evaluated in the context of the global COVID-19 pandemic, which necessitated consideration that the patient might be at risk for infection with the SARS-CoV-2 virus that causes COVID-19. Institutional protocols and algorithms that pertain to the evaluation of patients at risk for COVID-19 are in a state of rapid change based on information released by regulatory bodies including the CDC and federal and state organizations. These policies and algorithms were followed during the patient's care in the ED. ____________________________________________  FINAL CLINICAL IMPRESSION(S) / ED DIAGNOSES  Final diagnoses:  Pain due to dental caries       Lissa Hoard, PA-C 12/06/20 1438    Chesley Noon, MD 12/06/20 2153

## 2021-03-15 ENCOUNTER — Encounter: Payer: Self-pay | Admitting: Emergency Medicine

## 2021-03-15 ENCOUNTER — Emergency Department
Admission: EM | Admit: 2021-03-15 | Discharge: 2021-03-15 | Disposition: A | Discharge status: Home / Self Care | Payer: Self-pay | Attending: Emergency Medicine | Admitting: Emergency Medicine

## 2021-03-15 ENCOUNTER — Other Ambulatory Visit: Payer: Self-pay

## 2021-03-15 DIAGNOSIS — J029 Acute pharyngitis, unspecified: Secondary | ICD-10-CM | POA: Insufficient documentation

## 2021-03-15 LAB — GROUP A STREP BY PCR: Group A Strep by PCR: NOT DETECTED

## 2021-03-15 MED ORDER — LIDOCAINE VISCOUS HCL 2 % MT SOLN: 15.0000 mL | OROMUCOSAL | 0 refills | Status: DC | PRN

## 2021-03-15 NOTE — ED Provider Notes (Signed)
[facility] Emergency Department Provider Note  ____________________________________________   Event Date/Time   First MD Initiated Contact with Patient 03/15/21 1034     (approximate)  I have reviewed the triage vital signs and the nursing notes.   HISTORY  Chief Complaint Sore Throat    HPI Chris Hart is a 35 y.o. male Modena Jansky to the emergency department with sore throat for 2 days.  No fever or chills.  States he saw a white patch on the back of his throat.  No recent cold symptoms.  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 Authorizing Provider  lidocaine (XYLOCAINE) 2 % solution Use as directed 15 mLs in the mouth or throat every 4 (four) hours as needed for mouth pain. 03/15/21   Faythe Ghee, PA-C    Allergies Bactrim [sulfamethoxazole-trimethoprim]  No family history on file.  Social History Social History   Tobacco Use   Smoking status: Never   Smokeless tobacco: Never  Vaping Use   Vaping Use: Never used  Substance Use Topics   Alcohol use: Yes    Comment: rarely   Drug use: No    Review of Systems  Constitutional: No fever/chills Eyes: No visual changes. ENT: Positive sore throat. Respiratory: Denies cough Cardiovascular: Denies chest pain Gastrointestinal: Denies abdominal pain Genitourinary: Negative for dysuria. Musculoskeletal: Negative for back pain. Skin: Negative for  rash. Psychiatric: no mood changes,     ____________________________________________   PHYSICAL EXAM:  VITAL SIGNS: ED Triage Vitals  Enc Vitals Group     BP 03/15/21 0823 124/82     Pulse Rate 03/15/21 0823 85     Resp 03/15/21 0823 19     Temp 03/15/21 0823 98.5 F (36.9 C)     Temp Source 03/15/21 0823 Oral     SpO2 03/15/21 0823 95 %     Weight 03/15/21 0820 299 lb 13.2 oz (136 kg)     Height 03/15/21 0820 6' (1.829 m)     Head Circumference --      Peak Flow --      Pain Score 03/15/21 0820 0     Pain Loc --      Pain Edu? --      Excl. in GC? --     Constitutional: Alert and oriented. Well appearing and in no acute distress. Eyes: Conjunctivae are normal.  Head: Atraumatic. Nose: No congestion/rhinnorhea. Mouth/Throat: Mucous membranes are moist.  Ulcer noted on the left tonsil Neck:  supple no lymphadenopathy noted Cardiovascular: Normal rate, regular rhythm. Heart sounds are normal Respiratory: Normal respiratory effort.  No retractions, lungs c t a  GU: deferred Musculoskeletal: FROM all extremities, warm and well perfused Neurologic:  Normal speech and language.  Skin:  Skin is warm, dry and intact. No rash noted. Psychiatric: Mood and affect are normal. Speech and behavior are normal.  ____________________________________________   LABS (all labs ordered are listed,  but only abnormal results are displayed)  Labs Reviewed  GROUP A STREP BY PCR   ____________________________________________   ____________________________________________  RADIOLOGY    ____________________________________________   PROCEDURES  Procedure(s) performed: No  Procedures    ____________________________________________   INITIAL IMPRESSION / ASSESSMENT AND PLAN / ED COURSE  Pertinent labs & imaging results that were available during my care of the patient were reviewed by me and considered in my medical decision making (see chart for details).   The  patient is 35 year old male presents with sore throat.  See HPI.  Physical exam is consistent with a viral entity however we will do strep test to ensure he is not have strep throat.  Rapid test ordered.  Patient was given viscous lidocaine.  We will call in antibiotic if strep test is positive.  Test is negative, called patient to notify  Chris Hart was evaluated in Emergency Department on 03/15/2021 for the symptoms described in the history of present illness. He was evaluated in the context of the global COVID-19 pandemic, which necessitated consideration that the patient might be at risk for infection with the SARS-CoV-2 virus that causes COVID-19. Institutional protocols and algorithms that pertain to the evaluation of patients at risk for COVID-19 are in a state of rapid change based on information released by regulatory bodies including the CDC and federal and state organizations. These policies and algorithms were followed during the patient's care in the ED.    As part of my medical decision making, I reviewed the following data within the electronic MEDICAL RECORD NUMBER Nursing notes reviewed and incorporated, Labs reviewed , Old chart reviewed, Notes from prior ED visits, and [place] Controlled Substance Database  ____________________________________________   FINAL CLINICAL IMPRESSION(S) / ED DIAGNOSES  Final diagnoses:  Viral pharyngitis      NEW MEDICATIONS STARTED DURING THIS VISIT:  Discharge Medication List as of 03/15/2021 10:46 AM       Note:  This document was prepared using Dragon voice recognition software and may include unintentional dictation errors.    Faythe Ghee, PA-C 03/15/21 1237    Shaune Pollack, MD 03/16/21 2256

## 2021-03-15 NOTE — ED Triage Notes (Signed)
C/O sore throat.  AAOx3.  Skin warm and dry. NAD

## 2021-03-15 NOTE — Discharge Instructions (Signed)
Follow-up with your regular doctor if not improved in 2 to 3 days.  Return emergency department worsening.  Use medication as prescribed.  If your strep test is positive I will call you and call in antibiotic.

## 2022-04-21 ENCOUNTER — Telehealth: Payer: Self-pay | Admitting: Nurse Practitioner

## 2022-04-21 NOTE — Telephone Encounter (Signed)
Left vm and sent mychart message to confirm 04/27/22 appointment-Toni 

## 2022-04-27 ENCOUNTER — Ambulatory Visit: Payer: BC Managed Care – PPO | Admitting: Nurse Practitioner

## 2022-04-27 ENCOUNTER — Encounter: Payer: Self-pay | Admitting: Nurse Practitioner

## 2022-04-27 ENCOUNTER — Telehealth: Payer: Self-pay | Admitting: Nurse Practitioner

## 2022-04-27 VITALS — BP 137/88 | HR 85 | Temp 98.2°F | Ht 72.0 in | Wt 320.4 lb

## 2022-04-27 DIAGNOSIS — R635 Abnormal weight gain: Secondary | ICD-10-CM

## 2022-04-27 DIAGNOSIS — E538 Deficiency of other specified B group vitamins: Secondary | ICD-10-CM | POA: Diagnosis not present

## 2022-04-27 DIAGNOSIS — Z981 Arthrodesis status: Secondary | ICD-10-CM

## 2022-04-27 DIAGNOSIS — R2231 Localized swelling, mass and lump, right upper limb: Secondary | ICD-10-CM

## 2022-04-27 DIAGNOSIS — K1379 Other lesions of oral mucosa: Secondary | ICD-10-CM

## 2022-04-27 DIAGNOSIS — R040 Epistaxis: Secondary | ICD-10-CM

## 2022-04-27 DIAGNOSIS — Z7689 Persons encountering health services in other specified circumstances: Secondary | ICD-10-CM

## 2022-04-27 DIAGNOSIS — E559 Vitamin D deficiency, unspecified: Secondary | ICD-10-CM

## 2022-04-27 DIAGNOSIS — E782 Mixed hyperlipidemia: Secondary | ICD-10-CM

## 2022-04-27 NOTE — Telephone Encounter (Signed)
Awaiting 04/27/22 office notes for Otolaryngology referral-Chris Hart

## 2022-04-27 NOTE — Progress Notes (Signed)
[facility] [place], [place] [address]  Internal MEDICINE  Office Visit Note  Patient Name: Chris Hart  [id number]  [id number]  Date of Service: 04/27/2022   Complaints/HPI Pt is here for establishment of PCP. Chief Complaint  Patient presents with   New Patient (Initial Visit)   HPI Chris Hart presents for a new patient visit to establish care.  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  Lives at home with wife and their 3 children, ages 2, 11, and 62.  Diet and exercise could be better. He is a nonsmoker, rarely drinks, at most only a few times a year, less than once a month. Denies any illicit drug use. Has chronic leg and knee pain.  --need established PCP for preventive and routine medical care. Also has some current concerns that may need referrals.  Nose bleeds -- left nare spontaneous nosebleeds.   External hemorrhoid - conservative tx, manageable with OTC Depression -- therapist or med? Was diagnosed with depression as a child,reports depressed mood currently, anhedonia, tired, overeating, feels hopeless. See screening below Spot been there for a while, purple and blue inside mouth, does not hurt. Right side of mouth, teeth keep scraping and brushing the area making it inflamed  Lump/nodule that is palpable on the right anterior upper arm. Does not bother him and is only tender if it is messed with or push on a lot.       04/27/2022    8:25 AM  Depression screen PHQ 2/9  Decreased Interest 3  Down, Depressed, Hopeless 3  PHQ - 2 Score 6  Altered sleeping 3  Tired, decreased energy 0  Change in appetite 2  Feeling bad or failure about yourself  3  Trouble concentrating 0  Moving slowly or fidgety/restless 1  Suicidal thoughts 0  PHQ-9 Score 15  Difficult doing work/chores Somewhat difficult     Current Medication: Outpatient Encounter Medications as of  04/27/2022  Medication Sig   lidocaine (XYLOCAINE) 2 % solution Use as directed 15 mLs in the mouth or throat every 4 (four) hours as needed for mouth pain.   No facility-administered encounter medications on file as of 04/27/2022.    Surgical History: Past Surgical History:  Procedure Laterality Date   ANTERIOR CERVICAL DECOMP/DISCECTOMY FUSION N/A 08/19/2018   Procedure: ANTERIOR CERVICAL DECOMPRESSION/DISCECTOMY FUSION 2 LEVEL;  Surgeon: Chris Perla, MD;  Location: [facility] ORS;  Service: Neurosurgery;  Laterality: N/A;   WISDOM TOOTH EXTRACTION      Medical History: Past Medical History:  Diagnosis Date   Epilepsy ([place])    as a child    Family History: Family History  Problem Relation Age of Onset   Cancer Father     Social History   Socioeconomic History   Marital status: Married    Spouse name: Not on file   Number of children: Not on file   Years of education: Not on file   Highest education level: Not on file  Occupational History   Not on file  Tobacco Use   Smoking status: Never   Smokeless tobacco: Never  Vaping Use   Vaping Use: Never used  Substance and Sexual Activity   Alcohol use: Yes    Comment: rarely   Drug use: No   Sexual activity: Not on file  Other Topics Concern   Not on file  Social History Narrative   Not on file  Social Determinants of Health   Financial Resource Strain: Not on file  Food Insecurity: Not on file  Transportation Needs: Not on file  Physical Activity: Not on file  Stress: Not on file  Social Connections: Not on file  Intimate Partner Violence: Not on file     Review of Systems  Constitutional:  Negative for chills, fatigue and unexpected weight change.  HENT:  Positive for dental problem (teeth are constantly rubbing and scraping against a bump on the right side of his mouth directly under the surface of the posterior molars on the right side), nosebleeds (left nare) and postnasal drip. Negative for congestion,  rhinorrhea, sneezing and sore throat.   Eyes:  Negative for redness.  Respiratory:  Negative for cough, chest tightness, shortness of breath and wheezing.   Cardiovascular: Negative.  Negative for chest pain and palpitations.  Gastrointestinal:  Negative for abdominal pain, constipation, diarrhea, nausea and vomiting.       External hemorrhoid, manageable with OTC interventions and topical products  Genitourinary:  Negative for dysuria and frequency.  Musculoskeletal:  Positive for arthralgias. Negative for back pain, joint swelling and neck pain.  Skin:  Negative for rash.       Superficial nontender lump on right upper extremity, possible lipoma  Neurological: Negative.  Negative for tremors and numbness.  Hematological:  Negative for adenopathy. Does not bruise/bleed easily.  Psychiatric/Behavioral:  Positive for behavioral problems (Depression), decreased concentration, dysphoric mood and sleep disturbance. Negative for self-injury and suicidal ideas. The patient is not nervous/anxious.     Vital Signs: BP 137/88   Pulse 85   Temp 98.2 F (36.8 C)   Ht 6' (1.829 m)   Wt (!) 320 lb 6.4 oz (145.3 kg)   SpO2 97%   BMI 43.45 kg/m    Physical Exam Vitals reviewed.  Constitutional:      General: He is not in acute distress.    Appearance: Normal appearance. He is obese. He is not ill-appearing.  HENT:     Head: Normocephalic and atraumatic.     Nose:     Left Nostril: Epistaxis present.     Right Turbinates: Enlarged.     Left Turbinates: Enlarged.  Eyes:     Pupils: Pupils are equal, round, and reactive to light.  Cardiovascular:     Rate and Rhythm: Normal rate and regular rhythm.  Pulmonary:     Effort: Pulmonary effort is normal. No respiratory distress.  Skin:         Comments: Location of superficial palpable lump/nodule  Neurological:     Mental Status: He is alert and oriented to person, place, and time.  Psychiatric:        Mood and Affect: Mood normal.         Behavior: Behavior normal.       Assessment/Plan: 1. Left-sided epistaxis Recurrent spontaneous nosebleeds, CBC ordered as well as ENT referral for further evaluation.  - Ambulatory referral to ENT - CBC with Differential/Platelet  2. Localized swelling, mass, or lump of upper extremity, right Nonvascular ultrasound of lump on right upper extremity ordered, will discuss results at next office visit.  - [place] RT UPPER EXTREM LTD SOFT TISSUE NON VASCULAR; Future  3. Lump in mouth ENT referral to evaluate lump on right side of mouth - Ambulatory referral to ENT  4. B12 deficiency Routine labs ordered - CMP14+EGFR - CBC with Differential/Platelet - B12 and Folate Panel  5. Mixed hyperlipidemia Routine labs ordered - CMP14+EGFR -  Lipid Profile - TSH + free T4  6. Vitamin D deficiency Routine lab ordered - Vitamin D (25 hydroxy)  7. Abnormal weight gain Routine labs ordered - CMP14+EGFR - Lipid Profile - TSH + free T4  8. Encounter to establish care with new doctor Establishing PCP for preventive and routine medical care. Depression was discussed and his PHQ-9 score was significant. He plans to think about his decision and discuss with spouse whether he wants to try a therapist, or medication or both. We will discuss at next visit.  - CMP14+EGFR - CBC with Differential/Platelet - Lipid Profile - TSH + free T4 - B12 and Folate Panel - Vitamin D (25 hydroxy)   General Counseling: Dawan verbalizes understanding of the findings of todays visit and agrees with plan of treatment. I have discussed any further diagnostic evaluation that may be needed or ordered today. We also reviewed his medications today. he has been encouraged to call the office with any questions or concerns that should arise related to todays visit.    Counseling:  [place] Controlled Substance Database was reviewed by me.  Orders Placed This Encounter  Procedures   [place] RT UPPER EXTREM LTD SOFT TISSUE  NON VASCULAR   CMP14+EGFR   CBC with Differential/Platelet   Lipid Profile   TSH + free T4   B12 and Folate Panel   Vitamin D (25 hydroxy)   Ambulatory referral to ENT    No orders of the defined types were placed in this encounter.   Return for CPE, Jennea Rager PCP at earliest opening available -- have labs done prior to appt. .  Time spent:30 Minutes Time spent with patient included reviewing progress notes, labs, imaging studies, and discussing plan for follow up.   [place] Controlled Substance Database was reviewed by me for overdose risk score (ORS)   This patient was seen by Jonetta Osgood, FNP-C in collaboration with Dr. Clayborn Bigness as a part of collaborative care agreement.    Joe Tanney R. Valetta Fuller, MSN, FNP-C Internal Medicine

## 2022-04-29 ENCOUNTER — Encounter: Payer: Self-pay | Admitting: Nurse Practitioner

## 2022-05-01 ENCOUNTER — Telehealth: Payer: Self-pay | Admitting: Nurse Practitioner

## 2022-05-01 NOTE — Telephone Encounter (Signed)
Otolaryngology referral sent via [employer] to Dr. Richardson Landry with [facility]

## 2022-05-02 ENCOUNTER — Ambulatory Visit
Admission: RE | Admit: 2022-05-02 | Discharge: 2022-05-02 | Disposition: A | Discharge status: Home / Self Care | Payer: BC Managed Care – PPO | Source: Ambulatory Visit | Attending: Nurse Practitioner | Admitting: Nurse Practitioner

## 2022-05-02 DIAGNOSIS — R2231 Localized swelling, mass and lump, right upper limb: Secondary | ICD-10-CM | POA: Insufficient documentation

## 2022-05-02 DIAGNOSIS — M7989 Other specified soft tissue disorders: Secondary | ICD-10-CM | POA: Diagnosis not present

## 2022-05-12 ENCOUNTER — Telehealth: Payer: Self-pay

## 2022-05-12 NOTE — Progress Notes (Signed)
Please let the patient know that his ultrasound results shows that the area of concern is most consistent with a lipoma. No intervention needed unless he would like to discuss surgical removal.

## 2022-05-12 NOTE — Telephone Encounter (Signed)
-----   Message from Sallyanne Kuster, NP sent at 05/12/2022  2:42 PM EST ----- Please let the patient know that his ultrasound results shows that the area of concern is most consistent with a lipoma. No intervention needed unless he would like to discuss surgical removal.

## 2022-05-12 NOTE — Telephone Encounter (Signed)
Send Mychart message due to pt phone is not working

## 2022-05-12 NOTE — Progress Notes (Signed)
Try to call pt  phone not working

## 2022-05-26 ENCOUNTER — Telehealth: Payer: Self-pay | Admitting: Nurse Practitioner

## 2022-05-26 NOTE — Telephone Encounter (Signed)
Phone not working, sent [employer] to confirm 06/02/22 appointment-Toni

## 2022-06-02 ENCOUNTER — Encounter: Payer: BC Managed Care – PPO | Admitting: Nurse Practitioner

## 2022-06-28 ENCOUNTER — Telehealth: Payer: Self-pay | Admitting: Nurse Practitioner

## 2022-06-28 NOTE — Telephone Encounter (Signed)
Per [facility] w/  [facility], referral has been closed due to patient not returning their calls to schedule appointment-Toni 

## 2022-08-23 ENCOUNTER — Encounter: Payer: Self-pay | Admitting: Intensive Care

## 2022-08-23 ENCOUNTER — Other Ambulatory Visit: Payer: Self-pay

## 2022-08-23 ENCOUNTER — Emergency Department
Admission: EM | Admit: 2022-08-23 | Discharge: 2022-08-23 | Disposition: A | Discharge status: Home / Self Care | Payer: BC Managed Care – PPO | Attending: Emergency Medicine | Admitting: Emergency Medicine

## 2022-08-23 ENCOUNTER — Emergency Department: Payer: BC Managed Care – PPO

## 2022-08-23 DIAGNOSIS — N50812 Left testicular pain: Secondary | ICD-10-CM | POA: Insufficient documentation

## 2022-08-23 DIAGNOSIS — Z1152 Encounter for screening for COVID-19: Secondary | ICD-10-CM | POA: Diagnosis not present

## 2022-08-23 DIAGNOSIS — N50811 Right testicular pain: Secondary | ICD-10-CM | POA: Diagnosis not present

## 2022-08-23 LAB — URINALYSIS, ROUTINE W REFLEX MICROSCOPIC
Bilirubin Urine: NEGATIVE
Glucose, UA: NEGATIVE mg/dL
Hgb urine dipstick: NEGATIVE
Ketones, ur: NEGATIVE mg/dL
Leukocytes,Ua: NEGATIVE
Nitrite: NEGATIVE
Protein, ur: NEGATIVE mg/dL
Specific Gravity, Urine: 1.006 (ref 1.005–1.030)
pH: 6 (ref 5.0–8.0)

## 2022-08-23 LAB — CHLAMYDIA/NGC RT PCR (ARMC ONLY)
Chlamydia Tr: NOT DETECTED
N gonorrhoeae: NOT DETECTED

## 2022-08-23 LAB — RESP PANEL BY RT-PCR (RSV, FLU A&B, COVID)  RVPGX2
Influenza A by PCR: NEGATIVE
Influenza B by PCR: NEGATIVE
Resp Syncytial Virus by PCR: NEGATIVE
SARS Coronavirus 2 by RT PCR: NEGATIVE

## 2022-08-23 MED ORDER — KETOROLAC TROMETHAMINE 15 MG/ML IJ SOLN
15.0000 mg | Freq: Once | INTRAMUSCULAR | Status: AC
  Administered 2022-08-23: 15 mg via INTRAMUSCULAR
  Filled 2022-08-23: qty 1

## 2022-08-23 MED ORDER — ACETAMINOPHEN 500 MG PO TABS
1000.0000 mg | ORAL_TABLET | Freq: Once | ORAL | Status: AC
  Administered 2022-08-23: 1000 mg via ORAL
  Filled 2022-08-23: qty 2

## 2022-08-23 MED ORDER — ONDANSETRON 4 MG PO TBDP
4.0000 mg | ORAL_TABLET | Freq: Once | ORAL | Status: AC
  Administered 2022-08-23: 4 mg via ORAL
  Filled 2022-08-23: qty 1

## 2022-08-23 NOTE — ED Triage Notes (Signed)
Patient c/o bilateral testicle pain after awakening this AM. Reports some swelling. C/o nausea.   Reports everyone was sick in his house last week.

## 2022-08-23 NOTE — ED Provider Notes (Addendum)
[facility] Provider Note    Event Date/Time   First MD Initiated Contact with Patient 08/23/22 [id number]     (approximate)   History   Testicle Pain   HPI  Chris Hart is a 37 y.o. male who is otherwise healthy comes in with bilateral testicle pain awaking him this morning.  Does report some nausea associated with it patient states that he woke up this morning with bilateral testicular pain.  He denies ever having this previously.  Denies any known injuries.  He denies any discharge or redness.  He states that he has been with 1 partner his wife for the past 17 years.  Does report that he has had some family members who have been sick but they were seen in the emergency room on Friday were negative for COVID, flu.  He denies any prior testicular problems in the past.  Denies any shortness of breath leg swelling or other concerns.     Physical Exam   Triage Vital Signs: ED Triage Vitals  Enc Vitals Group     BP 08/23/22 0941 (!) 140/93     Pulse Rate 08/23/22 0941 82     Resp 08/23/22 0941 18     Temp 08/23/22 0941 97.9 F (36.6 C)     Temp Source 08/23/22 0941 Oral     SpO2 08/23/22 0941 96 %     Weight 08/23/22 0942 (!) 316 lb (143.3 kg)     Height 08/23/22 0942 '6\' 1"'$  (1.854 m)     Head Circumference --      Peak Flow --      Pain Score 08/23/22 0942 7     Pain Loc --      Pain Edu? --      Excl. in [place]? --     Most recent vital signs: Vitals:   08/23/22 0941  BP: (!) 140/93  Pulse: 82  Resp: 18  Temp: 97.9 F (36.6 C)  SpO2: 96%     General: Awake, no distress.  CV:  Good peripheral perfusion.  Resp:  Normal effort.  Abd:  No distention.  Soft and nontender.  No CVA tenderness Other:  Testicles palpated bilaterally with patient reported some mild discomfort.  No obvious swelling or erythema.  No inguinal hernia appreciated on examination.   circumcised, no penile discharge   ED Results / Procedures / Treatments   Labs (all  labs ordered are listed, but only abnormal results are displayed) Labs Reviewed  CHLAMYDIA/NGC RT PCR ([facility] ONLY)            URINALYSIS, ROUTINE W REFLEX MICROSCOPIC     RADIOLOGY I have reviewed the ultrasound personally and interpreted and no mass  PROCEDURES:  Critical Care performed: No  Procedures   MEDICATIONS ORDERED IN ED: Medications  acetaminophen (TYLENOL) tablet 1,000 mg (1,000 mg Oral Given 08/23/22 1037)  ketorolac (TORADOL) 15 MG/ML injection 15 mg (15 mg Intramuscular Given 08/23/22 1036)  ondansetron (ZOFRAN-ODT) disintegrating tablet 4 mg (4 mg Oral Given 08/23/22 1037)     IMPRESSION / MDM / ASSESSMENT AND PLAN / ED COURSE  I reviewed the triage vital signs and the nursing notes.   Patient's presentation is most consistent with acute presentation with potential threat to life or bodily function.   Patient comes in with bilateral testicle pain.  No obvious exam finding to explain cause.  Could be trauma from potentially laying on it wrong in bed.   UTI, STDs were  also tested.  Ultrasound ordered to rule out any kind of testicular torsion but seems less likely based upon examination.  Patient given some Tylenol, Toradol, Zofran to help with symptoms.  No symptoms to suggest kidney stone  Urine without any evidence of blood or UTI  Evaluated patient abdomen soft nontender.  Patient reports some improvement in pain.  We discussed reassuring workup he will follow-up in MyChart for his STD, viral screening.  We offered Zofran for discharge but patient declined.  We discussed Tylenol, ibuprofen and following up outpatient with urology for continued discomfort he expressed understanding and felt comfortable with discharge  Blood pressure was slightly elevated could be secondary to pain we will have him follow-up outpatient with PCP  I discussed the provisional nature of ED diagnosis, the treatment so far, the ongoing plan of care, follow up appointments and return  precautions with the patient and any family or support people present. They expressed understanding and agreed with the plan, discharged home.      FINAL CLINICAL IMPRESSION(S) / ED DIAGNOSES   Final diagnoses:  Pain in both testicles     Rx / DC Orders   ED Discharge Orders     None        Note:  This document was prepared using Dragon voice recognition software and may include unintentional dictation errors.   Vanessa Livingston Wheeler, MD 08/23/22 1107    Vanessa Castalia, MD 08/23/22 [phone]

## 2022-08-23 NOTE — Discharge Instructions (Addendum)
Workup was reassuring the rest the results are still pending follow these up in [place].  Your blood pressure was slightly elevated but that could be secondary to the pain.  Have this rechecked with your primary care doctor.  If you continue to have testicle pain please call urology Dr. Erlene Quan to make a follow-up appointment and return to the ER for fevers pain in your abdomen or back or worsening swelling or any other concerns  IMPRESSION: Normal testicular sonogram.  No signs of testicular torsion or mass.

## 2022-08-25 DIAGNOSIS — A084 Viral intestinal infection, unspecified: Secondary | ICD-10-CM | POA: Diagnosis not present

## 2022-08-25 DIAGNOSIS — K5900 Constipation, unspecified: Secondary | ICD-10-CM | POA: Diagnosis not present

## 2022-10-24 DIAGNOSIS — R0789 Other chest pain: Secondary | ICD-10-CM | POA: Diagnosis not present

## 2022-10-24 DIAGNOSIS — S46911A Strain of unspecified muscle, fascia and tendon at shoulder and upper arm level, right arm, initial encounter: Secondary | ICD-10-CM | POA: Diagnosis not present

## 2022-11-07 DIAGNOSIS — Z8669 Personal history of other diseases of the nervous system and sense organs: Secondary | ICD-10-CM | POA: Diagnosis not present

## 2022-11-07 DIAGNOSIS — G510 Bell's palsy: Secondary | ICD-10-CM | POA: Diagnosis not present

## 2022-11-10 ENCOUNTER — Emergency Department
Admission: EM | Admit: 2022-11-10 | Discharge: 2022-11-10 | Disposition: A | Discharge status: Home / Self Care | Payer: BC Managed Care – PPO | Attending: Emergency Medicine | Admitting: Emergency Medicine

## 2022-11-10 ENCOUNTER — Encounter: Payer: Self-pay | Admitting: Emergency Medicine

## 2022-11-10 ENCOUNTER — Emergency Department: Payer: BC Managed Care – PPO

## 2022-11-10 ENCOUNTER — Other Ambulatory Visit: Payer: Self-pay

## 2022-11-10 DIAGNOSIS — R519 Headache, unspecified: Secondary | ICD-10-CM | POA: Diagnosis not present

## 2022-11-10 DIAGNOSIS — G510 Bell's palsy: Secondary | ICD-10-CM | POA: Insufficient documentation

## 2022-11-10 DIAGNOSIS — R200 Anesthesia of skin: Secondary | ICD-10-CM | POA: Diagnosis not present

## 2022-11-10 MED ORDER — HYDROCODONE-ACETAMINOPHEN 5-325 MG PO TABS: 1.0000 | ORAL_TABLET | Freq: Four times a day (QID) | ORAL | 0 refills | Status: DC | PRN

## 2022-11-10 NOTE — ED Triage Notes (Signed)
Pt here with facial numbness and a headache. Pt states he had a headache on Sunday morning and then had facial numbness on Tues. Pt states he was seen and dx with Bells' Palsy. Pt states he had severe headache pain shooting up to his eyes last night and continues to have it. Pt denies NVD.

## 2022-11-10 NOTE — ED Provider Notes (Signed)
[facility] Provider Note    Event Date/Time   First MD Initiated Contact with Patient 11/10/22 [id number]     (approximate)   History   Numbness   HPI  Chris Hart is a 37 y.o. male presents to the ED with complaint of headache left-sided and also left-sided facial numbness.  Patient was seen 3 days ago and diagnosed with Bell's palsy and started on Valtrex and prednisone.  Patient states that his symptoms do not appear to be getting any better.  Patient has a history of Bell's palsy and this is the third episode he has had since age of 1.  Patient has a history of epilepsy and status post cervical spine fusion.     Physical Exam   Triage Vital Signs: ED Triage Vitals [11/10/22 0839]  Enc Vitals Group     BP (!) 131/93     Pulse Rate 99     Resp 18     Temp 98 F (36.7 C)     Temp Source Oral     SpO2 96 %     Weight (!) 315 lb 14.7 oz (143.3 kg)     Height 6\' 1"  (1.854 m)     Head Circumference      Peak Flow      Pain Score 6     Pain Loc      Pain Edu?      Excl. in [place]?     Most recent vital signs: Vitals:   11/10/22 0839  BP: (!) 131/93  Pulse: 99  Resp: 18  Temp: 98 F (36.7 C)  SpO2: 96%     General: Awake, no distress.  Alert, talkative CV:  Good peripheral perfusion.  Heart regular rate and rhythm. Resp:  Normal effort.  Lungs are clear bilaterally. Abd:  No distention.  Other:  PERRLA, EOMI's, there is deficit noted to the left facial area with ability to raise left eyebrow, left sided mouth drooping somewhat and decreased ability to keep his left eyelid closed.  Blinking of the eyelid is asymmetrical to the right in comparison.  Grip strength is equal bilaterally, both upper and lower extremities and patient is ambulatory without any assistance.  Nontender cervical spine posteriorly and range of motion without restriction.   ED Results / Procedures / Treatments   Labs (all labs ordered are listed, but only abnormal  results are displayed) Labs Reviewed - No data to display    RADIOLOGY CT head per radiologist is negative for acute intracranial changes.Chronic partially empty sella present which can be associated with idiopathic intracranial hypertension (pseudotumor cerebri)   Chronic partially empty sella, often a normal anatomic variant  but can be associated with idiopathic intracranial hypertension  (pseudotumor cerebri).    PROCEDURES:  Critical Care performed:   Procedures   MEDICATIONS ORDERED IN ED: Medications - No data to display   IMPRESSION / MDM / ASSESSMENT AND PLAN / ED COURSE  I reviewed the triage vital signs and the nursing notes.   Differential diagnosis includes, but is not limited to, CVA, Bell's palsy, intracranial mass, left facial deficit.  37 year old male presents to the ED with known history of Bell's palsy with this being his third episode.  He was seen 3 days ago at which time he was prescribed Valtrex and 60 mg of prednisone daily for 7 days.  Patient presented somewhat anxious that he does not see an improvement in his symptoms.  He also is continuing  to use natural tears and taping his eye shut at night.  CT scan was reassuring to patient however I did discuss with him the possibility of a pseudotumor cerebri and that he should follow-up with neurology for this.  He is to continue taking his prescribed medication, a prescription for hydrocodone was sent to the pharmacy if needed for headache.      Patient's presentation is most consistent with acute illness / injury with system symptoms.  FINAL CLINICAL IMPRESSION(S) / ED DIAGNOSES   Final diagnoses:  Bell's palsy     Rx / DC Orders   ED Discharge Orders          Ordered    HYDROcodone-acetaminophen (NORCO/VICODIN) 5-325 MG tablet  Every 6 hours PRN        11/10/22 1007             Note:  This document was prepared using Dragon voice recognition software and may include unintentional  dictation errors.   Tommi Rumps, PA-C 11/10/22 1240    Minna Antis, MD 11/10/22 1434

## 2022-11-10 NOTE — Discharge Instructions (Signed)
Call make an appointment with Dr. Sherryll Burger who is on-call for neurology.  His contact information and address are listed on your discharge papers.  He is located in [facility].  A prescription for hydrocodone was sent to the pharmacy for you to take if needed for headache and pain.  Continue taking your Valtrex and prednisone that you have started.

## 2022-11-10 NOTE — ED Notes (Signed)
See triage note  Presents with headache and some facial numbness   States he was recently dx'd with Bell's Palsy  Feels like headache is worse

## 2022-11-13 ENCOUNTER — Encounter: Payer: Self-pay | Admitting: Nurse Practitioner

## 2022-11-13 ENCOUNTER — Telehealth: Payer: Self-pay | Admitting: Nurse Practitioner

## 2022-11-13 ENCOUNTER — Ambulatory Visit: Payer: BC Managed Care – PPO | Admitting: Nurse Practitioner

## 2022-11-13 VITALS — BP 110/80 | HR 90 | Temp 98.3°F | Resp 16 | Ht 72.0 in | Wt 316.0 lb

## 2022-11-13 DIAGNOSIS — F321 Major depressive disorder, single episode, moderate: Secondary | ICD-10-CM | POA: Diagnosis not present

## 2022-11-13 DIAGNOSIS — G932 Benign intracranial hypertension: Secondary | ICD-10-CM | POA: Diagnosis not present

## 2022-11-13 DIAGNOSIS — G510 Bell's palsy: Secondary | ICD-10-CM

## 2022-11-13 MED ORDER — HYDROCODONE-ACETAMINOPHEN 5-325 MG PO TABS: 1.0000 | ORAL_TABLET | Freq: Four times a day (QID) | ORAL | 0 refills | Status: DC | PRN

## 2022-11-13 MED ORDER — SERTRALINE HCL 25 MG PO TABS: 25.0000 mg | ORAL_TABLET | Freq: Every day | ORAL | 3 refills | Status: DC

## 2022-11-13 NOTE — Progress Notes (Cosign Needed Addendum)
[facility] [address] [place], [place] [address]  Internal MEDICINE  Office Visit Note  Patient Name: Hart Hart  [id number]  [id number]  Date of Service: 11/13/2022  Chief Complaint  Patient presents with   Follow-up    ED follow up    HPI Chris Hart presents for a follow-up visit for bell's palsy, intracranial hypertension, depression and anxiety.  Bell's palsy-- light sensitivity and headaches -- seen in urgent care on 5/14 and treated with prednisone and valacyclovir. He went to the ED on 5/17 due to severe head pain or headache. Idiopathic Intracranial hypertension -- diagnosed in ED on 5/17 related to a pseudotumor cerebri found on head CT scan Pseudotumor cerebri --found on imaging in the ED, was recommended to be referred to neurology  Depressive and anxious -- have been experiencing increased depressive symptoms and anxiety. Has had anhedonia, wanting to stay in bed, depressed mood, sad affect, was diagnosed with anxiety and depression as a child and was on sertraline as a child   Current Medication: Outpatient Encounter Medications as of 11/13/2022  Medication Sig   predniSONE (DELTASONE) 20 MG tablet Take 20 mg by mouth daily with breakfast.   sertraline (ZOLOFT) 25 MG tablet Take 1 tablet (25 mg total) by mouth daily.   valACYclovir (VALTREX) 500 MG tablet Take 500 mg by mouth 2 (two) times daily.   [DISCONTINUED] HYDROcodone-acetaminophen (NORCO/VICODIN) 5-325 MG tablet Take 1 tablet by mouth every 6 (six) hours as needed.   HYDROcodone-acetaminophen (NORCO/VICODIN) 5-325 MG tablet Take 1 tablet by mouth every 6 (six) hours as needed for moderate pain or severe pain.   No facility-administered encounter medications on file as of 11/13/2022.    Surgical History: Past Surgical History:  Procedure Laterality Date   ANTERIOR CERVICAL DECOMP/DISCECTOMY FUSION N/A 08/19/2018   Procedure: ANTERIOR CERVICAL DECOMPRESSION/DISCECTOMY FUSION 2 LEVEL;   Surgeon: Chris Chris, MD;  Location: [facility] ORS;  Service: Neurosurgery;  Laterality: N/A;   WISDOM TOOTH EXTRACTION      Medical History: Past Medical History:  Diagnosis Date   Epilepsy (HCC)    as a child    Family History: Family History  Problem Relation Age of Onset   Cancer Father     Social History   Socioeconomic History   Marital status: Married    Spouse name: Not on file   Number of children: Not on file   Years of education: Not on file   Highest education level: Not on file  Occupational History   Not on file  Tobacco Use   Smoking status: Never   Smokeless tobacco: Never  Vaping Use   Vaping Use: Never used  Substance and Sexual Activity   Alcohol use: Yes    Comment: rarely   Drug use: No   Sexual activity: Not on file  Other Topics Concern   Not on file  Social History Narrative   Not on file   Social Determinants of Health   Financial Resource Strain: Not on file  Food Insecurity: Not on file  Transportation Needs: Not on file  Physical Activity: Not on file  Stress: Not on file  Social Connections: Not on file  Intimate Partner Violence: Not on file      Review of Systems  Constitutional:  Positive for activity change and fatigue.  HENT: Negative.    Eyes:        Cannot close left eye all the way due to bell's palsy  Respiratory: Negative.  Negative for cough,  chest tightness, shortness of breath and wheezing.   Cardiovascular: Negative.  Negative for chest pain and palpitations.  Gastrointestinal:  Negative for abdominal pain, constipation, diarrhea, nausea and vomiting.  Skin: Negative.   Neurological:  Positive for facial asymmetry and headaches.    Vital Signs: BP 110/80   Pulse 90   Temp 98.3 F (36.8 C)   Resp 16   Ht 6' (1.829 Hart)   Wt (!) 316 lb (143.3 kg)   SpO2 96%   BMI 42.86 kg/Hart    Physical Exam Vitals reviewed.  Constitutional:      General: He is not in acute distress.    Appearance: Normal appearance.  He is obese. He is not ill-appearing.  HENT:     Head: Normocephalic and atraumatic.  Neurological:     Mental Status: He is alert and oriented to person, place, and time.     Cranial Nerves: Facial asymmetry (left facial paralysis and droop) present.        Assessment/Plan: 1. Bell's palsy Referred to neurology, finish taking all of the valacyclovir and the prednisone taper.  - Ambulatory referral to Neurology  2. Idiopathic intracranial hypertension Referred to neurology - Ambulatory referral to Neurology  3. Pseudotumor cerebri Referred to neurology  - Ambulatory referral to Neurology  4. Depression, major, single episode, moderate (HCC) Will try sertraline as prescribed, follow up in 4 weeks to evaluate effectiveness.  - sertraline (ZOLOFT) 25 MG tablet; Take 1 tablet (25 mg total) by mouth daily.  Dispense: 30 tablet; Refill: 3    General Counseling: Hart Hart verbalizes understanding of the findings of todays visit and agrees with plan of treatment. I have discussed any further diagnostic evaluation that may be needed or ordered today. We also reviewed his medications today. he has been encouraged to call the office with any questions or concerns that should arise related to todays visit.    Orders Placed This Encounter  Procedures   Ambulatory referral to Neurology    Meds ordered this encounter  Medications   HYDROcodone-acetaminophen (NORCO/VICODIN) 5-325 MG tablet    Sig: Take 1 tablet by mouth every 6 (six) hours as needed for moderate pain or severe pain.    Dispense:  60 tablet    Refill:  0    For future refill, do not fill until patient request it.   sertraline (ZOLOFT) 25 MG tablet    Sig: Take 1 tablet (25 mg total) by mouth daily.    Dispense:  30 tablet    Refill:  3    Return for CPE, Hart Hart PCP at earliest available opening per patient preference. also need f/u for med eval.   Total time spent:30 Minutes Time spent includes review of chart,  medications, test results, and follow up plan with the patient.   [place] Controlled Substance Database was reviewed by me.  This patient was seen by Hart Kuster, FNP-C in collaboration with Hart Hart as a part of collaborative care agreement.   Hart Hart R. Chris Sias, MSN, FNP-C Internal medicine

## 2022-11-13 NOTE — Telephone Encounter (Signed)
Awaiting 11/13/22 office notes for Neurology referral-Toni

## 2022-11-14 ENCOUNTER — Telehealth: Payer: Self-pay | Admitting: Nurse Practitioner

## 2022-11-14 NOTE — Telephone Encounter (Signed)
Urgent Neurology referral sent via Prof[employer] to Dr. Shah with [facility]-Toni 

## 2022-11-17 ENCOUNTER — Telehealth: Payer: BC Managed Care – PPO | Admitting: Nurse Practitioner

## 2022-11-17 ENCOUNTER — Encounter: Payer: Self-pay | Admitting: Nurse Practitioner

## 2022-11-17 VITALS — Resp 16 | Ht 72.0 in | Wt 319.0 lb

## 2022-11-17 DIAGNOSIS — G932 Benign intracranial hypertension: Secondary | ICD-10-CM

## 2022-11-17 DIAGNOSIS — G510 Bell's palsy: Secondary | ICD-10-CM

## 2022-11-17 MED ORDER — ACETAZOLAMIDE ER 500 MG PO CP12: 500.0000 mg | ORAL_CAPSULE | Freq: Two times a day (BID) | ORAL | 1 refills | Status: DC

## 2022-11-17 MED ORDER — PREGABALIN 25 MG PO CAPS: 25.0000 mg | ORAL_CAPSULE | Freq: Two times a day (BID) | ORAL | 1 refills | Status: DC

## 2022-11-17 NOTE — Progress Notes (Signed)
[facility] [address] [place], [place] [address]  Internal MEDICINE  Telephone Visit  Patient Name: Chris Hart  [id number]  [id number]  Date of Service: 11/17/2022  I connected with the patient at 1100 by telephone and verified the patients identity using two identifiers.   I discussed the limitations, risks, security and privacy concerns of performing an evaluation and management service by telephone and the availability of in person appointments. I also discussed with the patient that there may be a patient responsible charge related to the service.  The patient expressed understanding and agrees to proceed.    Chief Complaint  Patient presents with   Telephone Screen    Past few days very bad headache on the left side, jaw irritation,    Telephone Assessment    HPI Chris Hart presents for a telehealth virtual visit for worsening headache on the left side and jaw irritation.  Recently seen in office for follow up regarding bell's palsy. Was seen in urgent care on on 5/14 and treated with valacyclovir and prednisone. Due to severe unilateral headache on the left side, was seen in ED on 5/17 and imaging confirmed a pseudotumor cerebri and intracranial hypertension.  --today, he is still having issues with the severe unilateral headache on the left side and jaw irritation on the left side of his jaw.  Sharp pain in jaw but sometimes dull, and has ringing in the ear.  Patient will see the neurology on june 13th.   Current Medication: Outpatient Encounter Medications as of 11/17/2022  Medication Sig   acetaZOLAMIDE ER (DIAMOX) 500 MG capsule Take 1 capsule (500 mg total) by mouth 2 (two) times daily.   HYDROcodone-acetaminophen (NORCO/VICODIN) 5-325 MG tablet Take 1 tablet by mouth every 6 (six) hours as needed for moderate pain or severe pain.   predniSONE (DELTASONE) 20 MG tablet Take 20 mg by mouth daily with breakfast.   pregabalin (LYRICA) 25 MG capsule Take 1  capsule (25 mg total) by mouth 2 (two) times daily.   sertraline (ZOLOFT) 25 MG tablet Take 1 tablet (25 mg total) by mouth daily.   valACYclovir (VALTREX) 500 MG tablet Take 500 mg by mouth 2 (two) times daily.   No facility-administered encounter medications on file as of 11/17/2022.    Surgical History: Past Surgical History:  Procedure Laterality Date   ANTERIOR CERVICAL DECOMP/DISCECTOMY FUSION N/A 08/19/2018   Procedure: ANTERIOR CERVICAL DECOMPRESSION/DISCECTOMY FUSION 2 LEVEL;  Surgeon: Lucy Chris, MD;  Location: [facility] ORS;  Service: Neurosurgery;  Laterality: N/A;   WISDOM TOOTH EXTRACTION      Medical History: Past Medical History:  Diagnosis Date   Epilepsy (HCC)    as a child    Family History: Family History  Problem Relation Age of Onset   Cancer Father     Social History   Socioeconomic History   Marital status: Married    Spouse name: Not on file   Number of children: Not on file   Years of education: Not on file   Highest education level: Not on file  Occupational History   Not on file  Tobacco Use   Smoking status: Never   Smokeless tobacco: Never  Vaping Use   Vaping Use: Never used  Substance and Sexual Activity   Alcohol use: Yes    Comment: rarely   Drug use: No   Sexual activity: Not on file  Other Topics Concern   Not on file  Social History Narrative   Not on  file   Social Determinants of Health   Financial Resource Strain: Not on file  Food Insecurity: Not on file  Transportation Needs: Not on file  Physical Activity: Not on file  Stress: Not on file  Social Connections: Not on file  Intimate Partner Violence: Not on file      Review of Systems  Respiratory: Negative.  Negative for cough, chest tightness, shortness of breath and wheezing.   Cardiovascular: Negative.  Negative for chest pain and palpitations.  Gastrointestinal: Negative.   Musculoskeletal:  Positive for arthralgias (left jaw).  Neurological:  Positive for  headaches.    Vital Signs: Resp 16   Ht 6' (1.829 m)   Wt (!) 319 lb (144.7 kg)   BMI 43.26 kg/m    Observation/Objective: He is alert and oriented and engages in conversation appropriately. No acute distress noted.     Assessment/Plan: 1. Bell's palsy Pregabalin prescribed for nerve pain.  - pregabalin (LYRICA) 25 MG capsule; Take 1 capsule (25 mg total) by mouth 2 (two) times daily.  Dispense: 60 capsule; Refill: 1  2. Idiopathic intracranial hypertension Start acetazolamide 500mg  twice daily and pregabalin 25 mg twice daily for nerve pain - acetaZOLAMIDE ER (DIAMOX) 500 MG capsule; Take 1 capsule (500 mg total) by mouth 2 (two) times daily.  Dispense: 60 capsule; Refill: 1 - pregabalin (LYRICA) 25 MG capsule; Take 1 capsule (25 mg total) by mouth 2 (two) times daily.  Dispense: 60 capsule; Refill: 1  3. Pseudotumor cerebri Acetazolamide twice daily prescribed.  - acetaZOLAMIDE ER (DIAMOX) 500 MG capsule; Take 1 capsule (500 mg total) by mouth 2 (two) times daily.  Dispense: 60 capsule; Refill: 1   General Counseling: Chris Hart verbalizes understanding of the findings of today's phone visit and agrees with plan of treatment. I have discussed any further diagnostic evaluation that may be needed or ordered today. We also reviewed his medications today. he has been encouraged to call the office with any questions or concerns that should arise related to todays visit.  Return if symptoms worsen or fail to improve.   No orders of the defined types were placed in this encounter.   Meds ordered this encounter  Medications   acetaZOLAMIDE ER (DIAMOX) 500 MG capsule    Sig: Take 1 capsule (500 mg total) by mouth 2 (two) times daily.    Dispense:  60 capsule    Refill:  1    For intracranial hypertension   pregabalin (LYRICA) 25 MG capsule    Sig: Take 1 capsule (25 mg total) by mouth 2 (two) times daily.    Dispense:  60 capsule    Refill:  1    Time spent:20 Minutes Time  spent with patient included reviewing progress notes, labs, imaging studies, and discussing plan for follow up.  [place] Controlled Substance Database was reviewed by me for overdose risk score (ORS) if appropriate.  This patient was seen by Sallyanne Kuster, FNP-C in collaboration with Dr. Beverely Risen as a part of collaborative care agreement.  Chris Hart R. Tedd Sias, MSN, FNP-C Internal medicine

## 2022-11-21 ENCOUNTER — Emergency Department
Admission: EM | Admit: 2022-11-21 | Discharge: 2022-11-21 | Disposition: A | Discharge status: Home / Self Care | Payer: BC Managed Care – PPO | Attending: Emergency Medicine | Admitting: Emergency Medicine

## 2022-11-21 ENCOUNTER — Telehealth: Payer: Self-pay

## 2022-11-21 ENCOUNTER — Other Ambulatory Visit: Payer: Self-pay

## 2022-11-21 ENCOUNTER — Encounter: Payer: Self-pay | Admitting: Emergency Medicine

## 2022-11-21 DIAGNOSIS — D72829 Elevated white blood cell count, unspecified: Secondary | ICD-10-CM | POA: Diagnosis not present

## 2022-11-21 DIAGNOSIS — G510 Bell's palsy: Secondary | ICD-10-CM | POA: Insufficient documentation

## 2022-11-21 DIAGNOSIS — R519 Headache, unspecified: Secondary | ICD-10-CM | POA: Diagnosis not present

## 2022-11-21 LAB — COMPREHENSIVE METABOLIC PANEL
ALT: 29 U/L (ref 0–44)
AST: 19 U/L (ref 15–41)
Albumin: 4.2 g/dL (ref 3.5–5.0)
Alkaline Phosphatase: 73 U/L (ref 38–126)
Anion gap: 6 (ref 5–15)
BUN: 20 mg/dL (ref 6–20)
CO2: 21 mmol/L — ABNORMAL LOW (ref 22–32)
Calcium: 8.8 mg/dL — ABNORMAL LOW (ref 8.9–10.3)
Chloride: 110 mmol/L (ref 98–111)
Creatinine, Ser: 1.07 mg/dL (ref 0.61–1.24)
GFR, Estimated: >60 mL/min (ref >60)
Glucose, Bld: 133 mg/dL — ABNORMAL HIGH (ref 70–99)
Potassium: 3.4 mmol/L — ABNORMAL LOW (ref 3.5–5.1)
Sodium: 137 mmol/L (ref 135–145)
Total Bilirubin: 1.3 mg/dL — ABNORMAL HIGH (ref 0.3–1.2)
Total Protein: 7.6 g/dL (ref 6.5–8.1)

## 2022-11-21 LAB — CBC
HCT: 42.5 % (ref 39.0–52.0)
Hemoglobin: 14.3 g/dL (ref 13.0–17.0)
MCH: 30.2 pg (ref 26.0–34.0)
MCHC: 33.6 g/dL (ref 30.0–36.0)
MCV: 89.9 fL (ref 80.0–100.0)
Platelets: 285 10*3/uL (ref 150–400)
RBC: 4.73 MIL/uL (ref 4.22–5.81)
RDW: 13.8 % (ref 11.5–15.5)
WBC: 11.9 10*3/uL — ABNORMAL HIGH (ref 4.0–10.5)
nRBC: 0 % (ref 0.0–0.2)

## 2022-11-21 MED ORDER — ACETAMINOPHEN 500 MG PO TABS
1000.0000 mg | ORAL_TABLET | Freq: Once | ORAL | Status: AC
  Administered 2022-11-21: 1000 mg via ORAL
  Filled 2022-11-21: qty 2

## 2022-11-21 MED ORDER — METOCLOPRAMIDE HCL 5 MG/ML IJ SOLN
10.0000 mg | Freq: Once | INTRAMUSCULAR | Status: AC
  Administered 2022-11-21: 10 mg via INTRAVENOUS
  Filled 2022-11-21: qty 2

## 2022-11-21 MED ORDER — MAGNESIUM SULFATE 2 GM/50ML IV SOLN
2.0000 g | Freq: Once | INTRAVENOUS | Status: AC
  Administered 2022-11-21: 2 g via INTRAVENOUS
  Filled 2022-11-21: qty 50

## 2022-11-21 MED ORDER — PREDNISONE 10 MG PO TABS: ORAL_TABLET | ORAL | 0 refills | Status: AC

## 2022-11-21 MED ORDER — PREDNISONE 20 MG PO TABS
60.0000 mg | ORAL_TABLET | Freq: Once | ORAL | Status: AC
  Administered 2022-11-21: 60 mg via ORAL
  Filled 2022-11-21: qty 3

## 2022-11-21 MED ORDER — KETOROLAC TROMETHAMINE 15 MG/ML IJ SOLN
15.0000 mg | Freq: Once | INTRAMUSCULAR | Status: AC
  Administered 2022-11-21: 15 mg via INTRAVENOUS
  Filled 2022-11-21: qty 1

## 2022-11-21 MED ORDER — DIPHENHYDRAMINE HCL 50 MG/ML IJ SOLN
12.5000 mg | Freq: Once | INTRAMUSCULAR | Status: AC
  Administered 2022-11-21: 12.5 mg via INTRAVENOUS
  Filled 2022-11-21: qty 1

## 2022-11-21 MED ORDER — SODIUM CHLORIDE 0.9 % IV BOLUS
500.0000 mL | Freq: Once | INTRAVENOUS | Status: AC
  Administered 2022-11-21: 500 mL via INTRAVENOUS

## 2022-11-21 NOTE — Discharge Instructions (Signed)
Continue taking your Diamox and follow-up with neurology as scheduled.  Take prednisone taper as prescribed.  Take acetaminophen 650 mg and ibuprofen 400 mg every 6 hours for pain.  Take with food.  If you develop any new or worsening symptoms come back to the emergency department for reevaluation.

## 2022-11-21 NOTE — ED Provider Notes (Signed)
[facility] Provider Note    Event Date/Time   First MD Initiated Contact with Patient 11/21/22 1200     (approximate)   History   Headache   HPI  Chris Hart is a 37 y.o. male   Past medical history of Bell's palsy 2 weeks ago diagnosed as well as concerns for idiopathic intracranial hypertension started on Diamox, and has been taking all of his prescribed medications including Valtrex, prednisone burst, Diamox who presents with recurrence of his headache.  He never really went away but symptoms were better when he was on his prednisone burst and now he has left-sided headache pain from his ear shooting down to his jaw and photosensitivity.  No trauma.  No fever. No other acute medical complaints.  Independent Historian contributed to assessment above: Member is at bedside corroborates information given above.  External Medical Documents Reviewed: Dr. Tedd Sias has a progress note from Nov 17, 2022 documenting this past medical history as above and his medications as above      Physical Exam   Triage Vital Signs: ED Triage Vitals [11/21/22 1035]  Enc Vitals Group     BP 123/85     Pulse Rate (!) 104     Resp 20     Temp 97.8 F (36.6 C)     Temp Source Oral     SpO2 96 %     Weight (!) 319 lb 0.1 oz (144.7 kg)     Height 6' (1.829 m)     Head Circumference      Peak Flow      Pain Score 9     Pain Loc      Pain Edu?      Excl. in GC?     Most recent vital signs: Vitals:   11/21/22 1304 11/21/22 1420  BP: 129/83 120/80  Pulse: 93 88  Resp: 19 20  Temp:    SpO2: 98% 98%    General: Awake, no distress.  CV:  Good peripheral perfusion.  Resp:  Normal effort.  Abd:  No distention.  Other:  Left-sided facial asymmetry.  No obvious rash to the ear canal or left side of the face, eyes, nose.  Neck supple with full range of motion.  No obvious cervical lymphadenopathy or swelling to the face.  Lungs clear, abdomen soft and  nontender skin appears euvolemic and he appears nontoxic with normal vital signs afebrile.   ED Results / Procedures / Treatments   Labs (all labs ordered are listed, but only abnormal results are displayed) Labs Reviewed  CBC - Abnormal; Notable for the following components:      Result Value   WBC 11.9 (*)    All other components within normal limits  COMPREHENSIVE METABOLIC PANEL - Abnormal; Notable for the following components:   Potassium 3.4 (*)    CO2 21 (*)    Glucose, Bld 133 (*)    Calcium 8.8 (*)    Total Bilirubin 1.3 (*)    All other components within normal limits     I ordered and reviewed the above labs they are notable for white blood cell count slightly elevated 11.9.    PROCEDURES:  Critical Care performed: No  Procedures   MEDICATIONS ORDERED IN ED: Medications  predniSONE (DELTASONE) tablet 60 mg (60 mg Oral Given 11/21/22 1250)  ketorolac (TORADOL) 15 MG/ML injection 15 mg (15 mg Intravenous Given 11/21/22 1252)  magnesium sulfate IVPB 2 g 50 mL (0  g Intravenous Stopped 11/21/22 1419)  metoCLOPramide (REGLAN) injection 10 mg (10 mg Intravenous Given 11/21/22 1255)  diphenhydrAMINE (BENADRYL) injection 12.5 mg (12.5 mg Intravenous Given 11/21/22 1252)  acetaminophen (TYLENOL) tablet 1,000 mg (1,000 mg Oral Given 11/21/22 1251)  sodium chloride 0.9 % bolus 500 mL (0 mLs Intravenous Stopped 11/21/22 1419)    IMPRESSION / MDM / ASSESSMENT AND PLAN / ED COURSE  I reviewed the triage vital signs and the nursing notes.                                Patient's presentation is most consistent with acute presentation with potential threat to life or bodily function.  Differential diagnosis includes, but is not limited to, migraine headache, Bell's palsy, herpes infection, meningitis, intracranial bleeding, CVA, IIH   The patient is on the cardiac monitor to evaluate for evidence of arrhythmia and/or significant heart rate changes.  MDM:    Medical  conditions including recent Bell's palsy and started on antivirals for viral infection, ongoing headache, suspicion for IIH started on Diamox and has been compliant with all medications.  No focal neurologic deficits to suggest stroke no trauma to suggest intracranial bleeding, no visual changes suggest worsening of his IIH, so will medicate with migraine cocktail reassess and if improved he will be discharged and follow-up with neurology as planned.         FINAL CLINICAL IMPRESSION(S) / ED DIAGNOSES   Final diagnoses:  Nonintractable headache, unspecified chronicity pattern, unspecified headache type     Rx / DC Orders   ED Discharge Orders          Ordered    predniSONE (DELTASONE) 10 MG tablet  Daily        11/21/22 1335             Note:  This document was prepared using Dragon voice recognition software and may include unintentional dictation errors.    Pilar Jarvis, MD 11/21/22 [id number]

## 2022-11-21 NOTE — Telephone Encounter (Signed)
Pt wife called  that change in behavior,numbness tingling in face and pressure in head alyssa spoke with wife as per alyssa due to risk of stroke  go to ER

## 2022-11-21 NOTE — ED Triage Notes (Signed)
Pt here with head pain. Pt states he was dx with Bells' Palsy 2 weeks ago. Pt then states last week he was having head pain and had scans done and was discharged. Pt c/o left side facial pain stating it hurts whenever he touches that side of his face, pt also has some swelling. Pt also c/o left ear pain and a mild rash.

## 2022-11-22 ENCOUNTER — Ambulatory Visit: Payer: BC Managed Care – PPO | Admitting: Nurse Practitioner

## 2022-11-22 ENCOUNTER — Encounter: Payer: Self-pay | Admitting: Nurse Practitioner

## 2022-11-22 VITALS — BP 120/86 | HR 91 | Temp 98.8°F | Resp 16 | Ht 72.0 in | Wt 313.0 lb

## 2022-11-22 DIAGNOSIS — G932 Benign intracranial hypertension: Secondary | ICD-10-CM | POA: Diagnosis not present

## 2022-11-22 NOTE — Progress Notes (Signed)
[facility] [address] [place], [place] [address]  Internal MEDICINE  Office Visit Note  Patient Name: Chris Hart  [id number]  [id number]  Date of Service: 11/22/2022  Chief Complaint  Patient presents with   Acute Visit    ED f/u for headache      HPI Chris Hart presents for an acute sick visit for worsening headache s/p diagnosis of IIH s/t pseudotumor cerebri.   1. Went to ER, was dismissed with just a headache and was denied any further evaluation or imaging regarding the change in mental status and severe worsening headache. We had a concern for possible stroke at that time. But this was not investigated seriously by the ER.      Current Medication:  Outpatient Encounter Medications as of 11/22/2022  Medication Sig   acetaZOLAMIDE ER (DIAMOX) 500 MG capsule Take 1 capsule (500 mg total) by mouth 2 (two) times daily.   HYDROcodone-acetaminophen (NORCO/VICODIN) 5-325 MG tablet Take 1 tablet by mouth every 6 (six) hours as needed for moderate pain or severe pain.   [EXPIRED] predniSONE (DELTASONE) 10 MG tablet Take 5 tablets (50 mg total) by mouth daily for 3 days, THEN 4 tablets (40 mg total) daily for 2 days, THEN 3 tablets (30 mg total) daily for 2 days, THEN 2 tablets (20 mg total) daily for 2 days, THEN 1 tablet (10 mg total) daily for 2 days.   sertraline (ZOLOFT) 25 MG tablet Take 1 tablet (25 mg total) by mouth daily.   valACYclovir (VALTREX) 500 MG tablet Take 500 mg by mouth 2 (two) times daily.   [DISCONTINUED] pregabalin (LYRICA) 25 MG capsule Take 1 capsule (25 mg total) by mouth 2 (two) times daily.   No facility-administered encounter medications on file as of 11/22/2022.      Medical History: Past Medical History:  Diagnosis Date   Epilepsy (HCC)    as a child     Vital Signs: BP 120/86   Pulse 91   Temp 98.8 F (37.1 C)   Resp 16   Ht 6' (1.829 m)   Wt (!) 313 lb (142 kg)   SpO2 97%   BMI 42.45 kg/m    Review of Systems   Respiratory: Negative.  Negative for cough, chest tightness, shortness of breath and wheezing.   Cardiovascular: Negative.  Negative for chest pain and palpitations.  Gastrointestinal: Negative.   Musculoskeletal:  Positive for arthralgias (left jaw).  Neurological:  Positive for headaches.    Physical Exam Vitals reviewed.  Constitutional:      General: Chris Hart is not in acute distress.    Appearance: Normal appearance. Chris Hart is obese. Chris Hart is not ill-appearing.  HENT:     Head: Normocephalic and atraumatic.  Eyes:     Pupils: Pupils are equal, round, and reactive to light.  Cardiovascular:     Rate and Rhythm: Normal rate and regular rhythm.  Pulmonary:     Effort: Pulmonary effort is normal. No respiratory distress.  Neurological:     Mental Status: Chris Hart is alert and oriented to person, place, and time.     Cranial Nerves: Facial asymmetry (left facial paralysis and droop) present.  Psychiatric:        Mood and Affect: Mood normal.        Behavior: Behavior normal.       Assessment/Plan: 1. Idiopathic intracranial hypertension Waiting to see neurology, continue acetazolamide and lyrica as prescribed.   2. Pseudotumor cerebri Upcoming appointment with neurology.  General Counseling: Chris Hart understanding of the findings of todays visit and agrees with plan of treatment. I have discussed any further diagnostic evaluation that may be needed or ordered today. We also reviewed his medications today. Chris Hart has been encouraged to call the office with any questions or concerns that should arise related to todays visit.    Counseling:    No orders of the defined types were placed in this encounter.   No orders of the defined types were placed in this encounter.   Return if symptoms worsen or fail to improve.  [place] Controlled Substance Database was reviewed by me for overdose risk score (ORS)  Time spent:20 Minutes Time spent with patient included reviewing progress  notes, labs, imaging studies, and discussing plan for follow up.   This patient was seen by Sallyanne Kuster, FNP-C in collaboration with Dr. Beverely Risen as a part of collaborative care agreement.  Kenasia Scheller R. Tedd Sias, MSN, FNP-C Internal Medicine

## 2022-11-27 ENCOUNTER — Telehealth: Payer: Self-pay | Admitting: Nurse Practitioner

## 2022-11-27 NOTE — Telephone Encounter (Signed)
Received disability paperwork from patient via email. Gave to Alyssa to complete-Toni

## 2022-11-30 ENCOUNTER — Telehealth: Payer: Self-pay | Admitting: Nurse Practitioner

## 2022-11-30 ENCOUNTER — Telehealth: Payer: Self-pay

## 2022-11-30 MED ORDER — ACETAZOLAMIDE 250 MG PO TABS: 250.0000 mg | ORAL_TABLET | Freq: Two times a day (BID) | ORAL | 3 refills | Status: DC

## 2022-11-30 MED ORDER — PREGABALIN 50 MG PO CAPS: 50.0000 mg | ORAL_CAPSULE | Freq: Two times a day (BID) | ORAL | 2 refills | Status: DC

## 2022-11-30 NOTE — Telephone Encounter (Signed)
Pt advised med sent

## 2022-11-30 NOTE — Telephone Encounter (Signed)
Spoke with patient over the phone-- has been having increased pain in left ear and headache, and tingling and numbness of the left side of his face.  Will increase the lyrica dose and the acetazolamide dose.

## 2022-11-30 NOTE — Telephone Encounter (Signed)
Patient picked up, letter, disability forms and work note-Toni

## 2022-11-30 NOTE — Telephone Encounter (Signed)
Lvm notifying patient letter is ready to be p/u-Toni

## 2022-11-30 NOTE — Telephone Encounter (Signed)
Spoke with patient over the phone-- has been having increased pain in left ear and headache

## 2022-12-03 ENCOUNTER — Encounter: Payer: Self-pay | Admitting: Nurse Practitioner

## 2022-12-07 ENCOUNTER — Telehealth: Payer: Self-pay | Admitting: Nurse Practitioner

## 2022-12-07 DIAGNOSIS — R519 Headache, unspecified: Secondary | ICD-10-CM | POA: Diagnosis not present

## 2022-12-07 DIAGNOSIS — G510 Bell's palsy: Secondary | ICD-10-CM | POA: Diagnosis not present

## 2022-12-07 NOTE — Telephone Encounter (Signed)
MB full, sent mychart message to confirm 12/14/22 appointment-Toni

## 2022-12-14 ENCOUNTER — Encounter: Payer: BC Managed Care – PPO | Admitting: Nurse Practitioner

## 2022-12-14 ENCOUNTER — Telehealth: Payer: Self-pay | Admitting: Nurse Practitioner

## 2022-12-14 NOTE — Telephone Encounter (Signed)
Neurology appointment 12/07/22/24 @ [facility]-Toni

## 2022-12-29 ENCOUNTER — Ambulatory Visit (INDEPENDENT_AMBULATORY_CARE_PROVIDER_SITE_OTHER): Payer: BC Managed Care – PPO | Admitting: Nurse Practitioner

## 2022-12-29 ENCOUNTER — Encounter: Payer: Self-pay | Admitting: Nurse Practitioner

## 2022-12-29 VITALS — BP 120/82 | HR 83 | Temp 98.6°F | Resp 16 | Ht 72.0 in | Wt 312.4 lb

## 2022-12-29 DIAGNOSIS — G510 Bell's palsy: Secondary | ICD-10-CM | POA: Diagnosis not present

## 2022-12-29 DIAGNOSIS — E782 Mixed hyperlipidemia: Secondary | ICD-10-CM

## 2022-12-29 DIAGNOSIS — Z0001 Encounter for general adult medical examination with abnormal findings: Secondary | ICD-10-CM | POA: Diagnosis not present

## 2022-12-29 DIAGNOSIS — G932 Benign intracranial hypertension: Secondary | ICD-10-CM

## 2022-12-29 NOTE — Progress Notes (Signed)
[facility] [address] [place], [place] [address]  Internal MEDICINE  Office Visit Note  Patient Name: Chris Hart  [id number]  [id number]  Date of Service: 12/29/2022  Chief Complaint  Patient presents with   Annual Exam    HPI Chris Hart presents for an annual well visit and physical exam.  Well-appearing 37 y.o. male with bell's palsy, pseudotumor cerebri, and intracranial hypertension.  Labs: due for cholesterol panel.  New or worsening pain: none Other concerns: headaches and facial pain has resolved. Some symptoms of bell's palsy remain present. Waiting to get eye exam done to evaluate for papilledema associated with pseudotumor cerebri.     Current Medication: Outpatient Encounter Medications as of 12/29/2022  Medication Sig   nortriptyline (PAMELOR) 10 MG capsule Start Nortriptyline (Pamelor) 10 mg nightly for one week, then increase to 20 mg nightly   pregabalin (LYRICA) 50 MG capsule Take 1 capsule (50 mg total) by mouth 2 (two) times daily.   sertraline (ZOLOFT) 25 MG tablet Take 1 tablet (25 mg total) by mouth daily.   valACYclovir (VALTREX) 500 MG tablet Take 500 mg by mouth 2 (two) times daily.   [DISCONTINUED] acetaZOLAMIDE (DIAMOX) 250 MG tablet Take 1 tablet (250 mg total) by mouth 2 (two) times daily. With the 500 mg capsule for a total dose of 750 mg twice daily.   [DISCONTINUED] acetaZOLAMIDE ER (DIAMOX) 500 MG capsule Take 1 capsule (500 mg total) by mouth 2 (two) times daily.   [DISCONTINUED] HYDROcodone-acetaminophen (NORCO/VICODIN) 5-325 MG tablet Take 1 tablet by mouth every 6 (six) hours as needed for moderate pain or severe pain.   No facility-administered encounter medications on file as of 12/29/2022.    Surgical History: Past Surgical History:  Procedure Laterality Date   ANTERIOR CERVICAL DECOMP/DISCECTOMY FUSION N/A 08/19/2018   Procedure: ANTERIOR CERVICAL DECOMPRESSION/DISCECTOMY FUSION 2 LEVEL;  Surgeon: Lucy Chris, MD;   Location: [facility] ORS;  Service: Neurosurgery;  Laterality: N/A;   WISDOM TOOTH EXTRACTION      Medical History: Past Medical History:  Diagnosis Date   Epilepsy (HCC)    as a child    Family History: Family History  Problem Relation Age of Onset   Cancer Father     Social History   Socioeconomic History   Marital status: Married    Spouse name: Not on file   Number of children: Not on file   Years of education: Not on file   Highest education level: Not on file  Occupational History   Not on file  Tobacco Use   Smoking status: Never   Smokeless tobacco: Never  Vaping Use   Vaping Use: Never used  Substance and Sexual Activity   Alcohol use: Yes    Comment: rarely   Drug use: No   Sexual activity: Not on file  Other Topics Concern   Not on file  Social History Narrative   Not on file   Social Determinants of Health   Financial Resource Strain: Not on file  Food Insecurity: Not on file  Transportation Needs: Not on file  Physical Activity: Not on file  Stress: Not on file  Social Connections: Not on file  Intimate Partner Violence: Not on file      Review of Systems  Constitutional:  Positive for activity change and fatigue.  HENT: Negative.    Eyes:        Cannot close left eye all the way due to bell's palsy  Respiratory: Negative.  Negative for  cough, chest tightness, shortness of breath and wheezing.   Cardiovascular: Negative.  Negative for chest pain and palpitations.  Gastrointestinal: Negative.  Negative for abdominal pain, constipation, diarrhea, nausea and vomiting.  Genitourinary: Negative.   Skin: Negative.   Neurological:  Positive for facial asymmetry, weakness (facial left side) and numbness (facial left side). Negative for headaches.  Psychiatric/Behavioral: Negative.      Vital Signs: BP 120/82   Pulse 83   Temp 98.6 F (37 C)   Resp 16   Ht 6' (1.829 m)   Wt (!) 312 lb 6.4 oz (141.7 kg)   SpO2 98%   BMI 42.37 kg/m     Physical Exam Vitals reviewed.  Constitutional:      General: He is awake. He is not in acute distress.    Appearance: Normal appearance. He is well-developed and well-groomed. He is obese. He is not ill-appearing.  HENT:     Head: Normocephalic and atraumatic.     Right Ear: Tympanic membrane, ear canal and external ear normal.     Left Ear: Tympanic membrane, ear canal and external ear normal.     Nose: Nose normal. No congestion or rhinorrhea.     Mouth/Throat:     Mouth: Mucous membranes are moist.     Pharynx: Oropharynx is clear. No oropharyngeal exudate or posterior oropharyngeal erythema.  Eyes:     Conjunctiva/sclera: Conjunctivae normal.     Pupils: Pupils are equal, round, and reactive to light.  Cardiovascular:     Rate and Rhythm: Normal rate and regular rhythm.     Heart sounds: Normal heart sounds. No murmur heard. Pulmonary:     Effort: Pulmonary effort is normal. No respiratory distress.     Breath sounds: Normal breath sounds. No wheezing.  Abdominal:     General: Bowel sounds are normal.     Palpations: Abdomen is soft.  Musculoskeletal:        General: Normal range of motion.     Cervical back: Normal range of motion and neck supple.  Skin:    General: Skin is warm and dry.     Capillary Refill: Capillary refill takes less than 2 seconds.  Neurological:     Mental Status: He is alert and oriented to person, place, and time.     Cranial Nerves: Facial asymmetry (left facial paralysis and droop) present.  Psychiatric:        Mood and Affect: Mood normal.        Behavior: Behavior normal. Behavior is cooperative.        Assessment/Plan: 1. Encounter for routine adult health examination with abnormal findings Age-appropriate preventive screenings and vaccinations discussed, annual physical exam completed. Routine labs for health maintenance ordered, see below. PHM updated.  - Lipid Profile  2. Idiopathic intracranial hypertension Diamox  discontinued, patient now taking nortriptyline prescribed by neurologist.  3. Pseudotumor cerebri Waiting for eye appointment for further evaluation   4. Bell's palsy Residual facial numbness, facial droop and facial muscle weakness. Does not have any pain or headaches at this time.   5. Mixed hyperlipidemia Routine cholesterol panel ordered  - Lipid Profile      General Counseling: Adriyel verbalizes understanding of the findings of todays visit and agrees with plan of treatment. I have discussed any further diagnostic evaluation that may be needed or ordered today. We also reviewed his medications today. he has been encouraged to call the office with any questions or concerns that should arise related to todays visit.  Orders Placed This Encounter  Procedures   Lipid Profile    No orders of the defined types were placed in this encounter.   Return in about 6 months (around 07/01/2023) for F/U, Michale Emmerich PCP.   Total time spent:30 Minutes Time spent includes review of chart, medications, test results, and follow up plan with the patient.   [place] Controlled Substance Database was reviewed by me.  This patient was seen by Sallyanne Kuster, FNP-C in collaboration with Dr. Beverely Risen as a part of collaborative care agreement.  Johnchristopher Sarvis R. Tedd Sias, MSN, FNP-C Internal medicine

## 2023-01-08 DIAGNOSIS — R519 Headache, unspecified: Secondary | ICD-10-CM | POA: Diagnosis not present

## 2023-01-08 DIAGNOSIS — G932 Benign intracranial hypertension: Secondary | ICD-10-CM | POA: Diagnosis not present

## 2023-01-08 DIAGNOSIS — G510 Bell's palsy: Secondary | ICD-10-CM | POA: Diagnosis not present

## 2023-01-15 DIAGNOSIS — H40009 Preglaucoma, unspecified, unspecified eye: Secondary | ICD-10-CM | POA: Diagnosis not present

## 2023-01-15 DIAGNOSIS — G510 Bell's palsy: Secondary | ICD-10-CM | POA: Diagnosis not present

## 2023-01-15 DIAGNOSIS — Z8669 Personal history of other diseases of the nervous system and sense organs: Secondary | ICD-10-CM | POA: Diagnosis not present

## 2023-01-15 DIAGNOSIS — G932 Benign intracranial hypertension: Secondary | ICD-10-CM | POA: Diagnosis not present

## 2023-02-20 DIAGNOSIS — G510 Bell's palsy: Secondary | ICD-10-CM | POA: Diagnosis not present

## 2023-02-20 DIAGNOSIS — G932 Benign intracranial hypertension: Secondary | ICD-10-CM | POA: Diagnosis not present

## 2023-02-20 DIAGNOSIS — H40009 Preglaucoma, unspecified, unspecified eye: Secondary | ICD-10-CM | POA: Diagnosis not present

## 2023-02-20 DIAGNOSIS — Z8669 Personal history of other diseases of the nervous system and sense organs: Secondary | ICD-10-CM | POA: Diagnosis not present

## 2023-03-13 DIAGNOSIS — G932 Benign intracranial hypertension: Secondary | ICD-10-CM | POA: Diagnosis not present

## 2023-03-13 DIAGNOSIS — G510 Bell's palsy: Secondary | ICD-10-CM | POA: Diagnosis not present

## 2023-03-13 DIAGNOSIS — R519 Headache, unspecified: Secondary | ICD-10-CM | POA: Diagnosis not present

## 2023-03-30 ENCOUNTER — Other Ambulatory Visit: Payer: Self-pay | Admitting: Nurse Practitioner

## 2023-03-30 DIAGNOSIS — F321 Major depressive disorder, single episode, moderate: Secondary | ICD-10-CM

## 2023-04-02 NOTE — Telephone Encounter (Signed)
Ok to send please review that interact other med

## 2023-05-03 ENCOUNTER — Telehealth (INDEPENDENT_AMBULATORY_CARE_PROVIDER_SITE_OTHER): Payer: BC Managed Care – PPO | Admitting: Physician Assistant

## 2023-05-03 ENCOUNTER — Encounter: Payer: Self-pay | Admitting: Physician Assistant

## 2023-05-03 VITALS — BP 122/81 | HR 90 | Resp 16 | Ht 72.0 in | Wt 315.0 lb

## 2023-05-03 DIAGNOSIS — J029 Acute pharyngitis, unspecified: Secondary | ICD-10-CM | POA: Diagnosis not present

## 2023-05-03 NOTE — Progress Notes (Signed)
[facility] [address] [place], [place] [address]  Internal MEDICINE  Telephone Visit  Patient Name: Chris Hart  [id number]  [id number]  Date of Service: 05/03/2023  I connected with the patient at 10:59 by telephone and verified the patients identity using two identifiers.   I discussed the limitations, risks, security and privacy concerns of performing an evaluation and management service by telephone and the availability of in person appointments. I also discussed with the patient that there may be a patient responsible charge related to the service.  The patient expressed understanding and agrees to proceed.    Chief Complaint  Patient presents with   Telephone Screen    Patient prefers telephone call: [phone]   Telephone Assessment   Sore Throat    HPI Pt is here for virtual sick visit -Sore throat yesterday morning, scratchy the night before -Got better during the day, but woke up this morning and throat felt worse again. Has been improving some again since waking up. -Denies fever, chills, sinus congestion, or coughing. -some mucus in throat -Not taking anything for it, No sick contacts -Needs work note for today  Current Medication: Outpatient Encounter Medications as of 05/03/2023  Medication Sig   nortriptyline (PAMELOR) 10 MG capsule Start Nortriptyline (Pamelor) 10 mg nightly for one week, then increase to 20 mg nightly   pregabalin (LYRICA) 50 MG capsule Take 1 capsule (50 mg total) by mouth 2 (two) times daily.   sertraline (ZOLOFT) 25 MG tablet Take 1 tablet by mouth once daily   valACYclovir (VALTREX) 500 MG tablet Take 500 mg by mouth 2 (two) times daily.   No facility-administered encounter medications on file as of 05/03/2023.    Surgical History: Past Surgical History:  Procedure Laterality Date   ANTERIOR CERVICAL DECOMP/DISCECTOMY FUSION N/A 08/19/2018   Procedure: ANTERIOR CERVICAL DECOMPRESSION/DISCECTOMY FUSION 2 LEVEL;   Surgeon: Lucy Chris, MD;  Location: [facility] ORS;  Service: Neurosurgery;  Laterality: N/A;   WISDOM TOOTH EXTRACTION      Medical History: Past Medical History:  Diagnosis Date   Epilepsy (HCC)    as a child    Family History: Family History  Problem Relation Age of Onset   Cancer Father     Social History   Socioeconomic History   Marital status: Married    Spouse name: Not on file   Number of children: Not on file   Years of education: Not on file   Highest education level: Not on file  Occupational History   Not on file  Tobacco Use   Smoking status: Never   Smokeless tobacco: Never  Vaping Use   Vaping status: Never Used  Substance and Sexual Activity   Alcohol use: Yes    Comment: rarely   Drug use: No   Sexual activity: Not on file  Other Topics Concern   Not on file  Social History Narrative   Not on file   Social Determinants of Health   Financial Resource Strain: Not on file  Food Insecurity: Not on file  Transportation Needs: Not on file  Physical Activity: Not on file  Stress: Not on file  Social Connections: Not on file  Intimate Partner Violence: Not on file      Review of Systems  Constitutional:  Negative for chills, fatigue and fever.  HENT:  Positive for sore throat. Negative for congestion, ear pain, mouth sores, postnasal drip and sinus pressure.   Respiratory:  Negative for cough, shortness of breath  and wheezing.   Cardiovascular:  Negative for chest pain.  Genitourinary:  Negative for flank pain.  Psychiatric/Behavioral: Negative.      Vital Signs: BP 122/81   Pulse 90   Resp 16   Ht 6' (1.829 m)   Wt (!) 315 lb (142.9 kg)   BMI 42.72 kg/m    Observation/Objective:  Pt is able to carry out conversation   Assessment/Plan: 1. Sore throat May be viral or environmental. Will try warm tea with honey, salt water gargles, and lozenges. May start mucinex if some mucus production. Advised to stay well hydrated. May try  chloraseptic spray for pain relief as needed. Call if new or worsening symptoms arise.   General Counseling: Chris Hart understanding of the findings of today's phone visit and agrees with plan of treatment. I have discussed any further diagnostic evaluation that may be needed or ordered today. We also reviewed his medications today. he has been encouraged to call the office with any questions or concerns that should arise related to todays visit.    No orders of the defined types were placed in this encounter.   No orders of the defined types were placed in this encounter.   Time spent:25 Minutes    Dr Lyndon Code Internal medicine

## 2023-05-04 ENCOUNTER — Telehealth: Payer: BC Managed Care – PPO | Admitting: Nurse Practitioner

## 2023-05-07 ENCOUNTER — Ambulatory Visit
Admission: EM | Admit: 2023-05-07 | Discharge: 2023-05-07 | Disposition: A | Discharge status: Home / Self Care | Payer: BC Managed Care – PPO | Attending: Emergency Medicine | Admitting: Emergency Medicine

## 2023-05-07 ENCOUNTER — Ambulatory Visit: Payer: BC Managed Care – PPO

## 2023-05-07 DIAGNOSIS — J40 Bronchitis, not specified as acute or chronic: Secondary | ICD-10-CM | POA: Diagnosis not present

## 2023-05-07 DIAGNOSIS — R058 Other specified cough: Secondary | ICD-10-CM | POA: Diagnosis not present

## 2023-05-07 MED ORDER — BENZONATATE 100 MG PO CAPS: 200.0000 mg | ORAL_CAPSULE | Freq: Three times a day (TID) | ORAL | 0 refills | Status: DC

## 2023-05-07 MED ORDER — PROMETHAZINE-DM 6.25-15 MG/5ML PO SYRP: 5.0000 mL | ORAL_SOLUTION | Freq: Four times a day (QID) | ORAL | 0 refills | Status: DC | PRN

## 2023-05-07 MED ORDER — DOXYCYCLINE HYCLATE 100 MG PO CAPS: 100.0000 mg | ORAL_CAPSULE | Freq: Two times a day (BID) | ORAL | 0 refills | Status: AC

## 2023-05-07 NOTE — ED Provider Notes (Signed)
[facility]    CSN: [id number] Arrival date & time: 05/07/23  1358      History   Chief Complaint Chief Complaint  Patient presents with   Cough   Headache    HPI Chris Hart is a 37 y.o. male.   HPI  37 year old male with past medical history significant for epilepsy as a child and cervical spinal fusion presents for evaluation of headache and productive cough for the last 4 days.  He reports that his mucus is green and stringy.  He denies any fever or URI symptoms.  He also denies shortness of breath or wheezing.  No known sick contacts.  Past Medical History:  Diagnosis Date   Epilepsy [facility])    as a child    Patient Active Problem List   Diagnosis Date Noted   S/P cervical spinal fusion 08/19/2018    Past Surgical History:  Procedure Laterality Date   ANTERIOR CERVICAL DECOMP/DISCECTOMY FUSION N/A 08/19/2018   Procedure: ANTERIOR CERVICAL DECOMPRESSION/DISCECTOMY FUSION 2 LEVEL;  Surgeon: Lucy Chris, MD;  Location: [facility] ORS;  Service: Neurosurgery;  Laterality: N/A;   WISDOM TOOTH EXTRACTION         Home Medications    Prior to Admission medications   Medication Sig Start Date End Date Taking? Authorizing Provider  benzonatate (TESSALON) 100 MG capsule Take 2 capsules (200 mg total) by mouth every 8 (eight) hours. 05/07/23  Yes Becky Augusta, NP  doxycycline (VIBRAMYCIN) 100 MG capsule Take 1 capsule (100 mg total) by mouth 2 (two) times daily for 7 days. 05/07/23 05/14/23 Yes Becky Augusta, NP  promethazine-dextromethorphan (PROMETHAZINE-DM) 6.25-15 MG/5ML syrup Take 5 mLs by mouth 4 (four) times daily as needed. 05/07/23  Yes Becky Augusta, NP  nortriptyline (PAMELOR) 10 MG capsule Start Nortriptyline (Pamelor) 10 mg nightly for one week, then increase to 20 mg nightly 09/08/19   [provider]  pregabalin (LYRICA) 50 MG capsule Take 1 capsule (50 mg total) by mouth 2 (two) times daily. 11/30/22   Sallyanne Kuster, NP  sertraline  (ZOLOFT) 25 MG tablet Take 1 tablet by mouth once daily 04/04/23   Sallyanne Kuster, NP  valACYclovir (VALTREX) 500 MG tablet Take 500 mg by mouth 2 (two) times daily.    [provider]    Family History Family History  Problem Relation Age of Onset   Cancer Father     Social History Social History   Tobacco Use   Smoking status: Never   Smokeless tobacco: Never  Vaping Use   Vaping status: Never Used  Substance Use Topics   Alcohol use: Yes    Comment: rarely   Drug use: No     Allergies   Bactrim [sulfamethoxazole-trimethoprim]   Review of Systems Review of Systems  Constitutional:  Negative for fever.  HENT:  Negative for congestion, ear pain, rhinorrhea and sore throat.   Respiratory:  Positive for cough. Negative for shortness of breath and wheezing.   Neurological:  Positive for headaches.     Physical Exam Triage Vital Signs ED Triage Vitals  Encounter Vitals Group     BP 05/07/23 1450 111/84     Systolic BP Percentile --      Diastolic BP Percentile --      Pulse Rate 05/07/23 1450 (!) 105     Resp 05/07/23 1450 18     Temp 05/07/23 1450 97.6 F (36.4 C)     Temp Source 05/07/23 1450 Temporal     SpO2 05/07/23  1450 94 %     Weight --      Height --      Head Circumference --      Peak Flow --      Pain Score 05/07/23 1452 7     Pain Loc --      Pain Education --      Exclude from Growth Chart --    No data found.  Updated Vital Signs BP 111/84 (BP Location: Left Arm)   Pulse (!) 105   Temp 97.6 F (36.4 C) (Temporal)   Resp 18   SpO2 94%   Visual Acuity Right Eye Distance:   Left Eye Distance:   Bilateral Distance:    Right Eye Near:   Left Eye Near:    Bilateral Near:     Physical Exam Vitals and nursing note reviewed.  Constitutional:      Appearance: Normal appearance. He is not ill-appearing.  HENT:     Head: Normocephalic and atraumatic.  Cardiovascular:     Rate and Rhythm: Normal rate and regular rhythm.      Pulses: Normal pulses.     Heart sounds: Normal heart sounds. No murmur heard.    No friction rub. No gallop.  Pulmonary:     Effort: Pulmonary effort is normal.     Breath sounds: Wheezing and rhonchi present. No rales.     Comments: Patient has wheezes and rhonchi on the left but the right is clear to auscultation. Neurological:     Mental Status: He is alert.      UC Treatments / Results  Labs (all labs ordered are listed, but only abnormal results are displayed) Labs Reviewed - No data to display  EKG   Radiology No results found.  Procedures Procedures (including critical care time)  Medications Ordered in UC Medications - No data to display  Initial Impression / Assessment and Plan / UC Course  I have reviewed the triage vital signs and the nursing notes.  Pertinent labs & imaging results that were available during my care of the patient were reviewed by me and considered in my medical decision making (see chart for details).   Patient is a nontoxic-appearing 37 year old male presenting for evaluation of 4 days worth of productive cough without associated fever or URI symptoms.  Also no shortness breath or wheezing.  On exam he does have wheezes and coarse rhonchi in the upper and middle aspect of the left lung.  Right lung is clear to auscultation.  I will obtain a chest x-ray to evaluate for acute cardiopulmonary pathology.  Chest x-ray independently reviewed and evaluated by me.  Impression: No evidence of infiltrate or effusion noted.  Cardiomediastinal silhouette is within normal limits.  Radiology overread is pending.  I will discharge patient home with a diagnosis of bronchitis and treated with a 7-day course of doxycycline along with Tessalon Perles and Promethazine DM cough syrup.   Final Clinical Impressions(s) / UC Diagnoses   Final diagnoses:  Bronchitis     Discharge Instructions      Your chest x-ray did not show any evidence of pneumonia but  your presentation is most consistent with bronchitis.  Take the doxycycline 100 mg twice daily with food for 7 days for treatment of bronchitis.  Use the Tessalon Perles every 8 hours for the days needed for cough.  Take them with a small sip of water.  They may give you numbness to the base of your tongue or metallic  taste in her mouth, this is normal.  Please use the Promethazine DM cough syrup at nighttime as needed for cough and congestion.  You may use over-the-counter Tylenol and/or ibuprofen according to package instructions as needed for any pain or if you develop fever.  Return for reevaluation or follow-up with your primary care provider for any continued or worsening symptoms.     ED Prescriptions     Medication Sig Dispense Auth. Provider   doxycycline (VIBRAMYCIN) 100 MG capsule Take 1 capsule (100 mg total) by mouth 2 (two) times daily for 7 days. 14 capsule Becky Augusta, NP   benzonatate (TESSALON) 100 MG capsule Take 2 capsules (200 mg total) by mouth every 8 (eight) hours. 21 capsule Becky Augusta, NP   promethazine-dextromethorphan (PROMETHAZINE-DM) 6.25-15 MG/5ML syrup Take 5 mLs by mouth 4 (four) times daily as needed. 118 mL Becky Augusta, NP      PDMP not reviewed this encounter.   Becky Augusta, NP 05/07/23 [phone]

## 2023-05-07 NOTE — ED Triage Notes (Signed)
Patient presents to UC for HA, cough, and congestion x 4 days. Treating symptoms with dayquil and nyquil.   Denies fever.

## 2023-05-07 NOTE — Discharge Instructions (Addendum)
Your chest x-ray did not show any evidence of pneumonia but your presentation is most consistent with bronchitis.  Take the doxycycline 100 mg twice daily with food for 7 days for treatment of bronchitis.  Use the Tessalon Perles every 8 hours for the days needed for cough.  Take them with a small sip of water.  They may give you numbness to the base of your tongue or metallic taste in her mouth, this is normal.  Please use the Promethazine DM cough syrup at nighttime as needed for cough and congestion.  You may use over-the-counter Tylenol and/or ibuprofen according to package instructions as needed for any pain or if you develop fever.  Return for reevaluation or follow-up with your primary care provider for any continued or worsening symptoms.

## 2023-05-29 ENCOUNTER — Other Ambulatory Visit: Payer: Self-pay | Admitting: Nurse Practitioner

## 2023-05-29 DIAGNOSIS — F321 Major depressive disorder, single episode, moderate: Secondary | ICD-10-CM

## 2023-07-02 ENCOUNTER — Encounter: Payer: Self-pay | Admitting: Nurse Practitioner

## 2023-07-02 ENCOUNTER — Ambulatory Visit (INDEPENDENT_AMBULATORY_CARE_PROVIDER_SITE_OTHER): Payer: BC Managed Care – PPO | Admitting: Nurse Practitioner

## 2023-07-02 ENCOUNTER — Other Ambulatory Visit: Payer: Self-pay | Admitting: Nurse Practitioner

## 2023-07-02 VITALS — BP 124/86 | HR 83 | Temp 98.6°F | Resp 16 | Ht 72.0 in | Wt 328.2 lb

## 2023-07-02 DIAGNOSIS — E66813 Obesity, class 3: Secondary | ICD-10-CM | POA: Diagnosis not present

## 2023-07-02 DIAGNOSIS — G932 Benign intracranial hypertension: Secondary | ICD-10-CM | POA: Diagnosis not present

## 2023-07-02 DIAGNOSIS — F411 Generalized anxiety disorder: Secondary | ICD-10-CM | POA: Diagnosis not present

## 2023-07-02 DIAGNOSIS — F3342 Major depressive disorder, recurrent, in full remission: Secondary | ICD-10-CM

## 2023-07-02 DIAGNOSIS — E538 Deficiency of other specified B group vitamins: Secondary | ICD-10-CM | POA: Diagnosis not present

## 2023-07-02 DIAGNOSIS — E559 Vitamin D deficiency, unspecified: Secondary | ICD-10-CM | POA: Diagnosis not present

## 2023-07-02 DIAGNOSIS — D519 Vitamin B12 deficiency anemia, unspecified: Secondary | ICD-10-CM | POA: Diagnosis not present

## 2023-07-02 DIAGNOSIS — D520 Dietary folate deficiency anemia: Secondary | ICD-10-CM | POA: Diagnosis not present

## 2023-07-02 DIAGNOSIS — Z6841 Body Mass Index (BMI) 40.0 and over, adult: Secondary | ICD-10-CM

## 2023-07-02 DIAGNOSIS — E782 Mixed hyperlipidemia: Secondary | ICD-10-CM | POA: Diagnosis not present

## 2023-07-02 MED ORDER — SERTRALINE HCL 50 MG PO TABS: 50.0000 mg | ORAL_TABLET | Freq: Every day | ORAL | 3 refills | Status: DC

## 2023-07-02 MED ORDER — TIRZEPATIDE-WEIGHT MANAGEMENT 2.5 MG/0.5ML ~~LOC~~ SOAJ: 2.5000 mg | SUBCUTANEOUS | 0 refills | Status: DC

## 2023-07-02 MED ORDER — TIRZEPATIDE-WEIGHT MANAGEMENT 5 MG/0.5ML ~~LOC~~ SOAJ: 5.0000 mg | SUBCUTANEOUS | 5 refills | Status: DC

## 2023-07-02 NOTE — Progress Notes (Signed)
 [facility] [address] [place], [place] [address]  Internal MEDICINE  Office Visit Note  Patient Name: Chris Hart  [id number]  [id number]  Date of Service: 07/02/2023  Chief Complaint  Patient presents with   Follow-up    HPI Chris Hart presents for follow up visit for depression, anxiety and pseudotumor cerebri.  Well-appearing 38 y.o. male with depression, anxiety, pseudotumor cerebri, intracranial hypertension and history of bell's palsy.  Eye exam: sees Dr. Dingledein at [facility] eye center.  New or worsening pain: none  Other concerns: weight loss, wants to lost weight, interested in GLP-1 Seeing neurology and ophthalmology related to the pseudotumor cerebri.  Due for labs today    Current Medication: Outpatient Encounter Medications as of 07/02/2023  Medication Sig   latanoprost (XALATAN) 0.005 % ophthalmic solution 1 drop at bedtime.   sertraline  (ZOLOFT ) 50 MG tablet Take 1 tablet (50 mg total) by mouth daily.   tirzepatide  (ZEPBOUND ) 2.5 MG/0.5ML Pen Inject 2.5 mg into the skin once a week.   [START ON 07/23/2023] tirzepatide  (ZEPBOUND ) 5 MG/0.5ML Pen Inject 5 mg into the skin once a week.   valACYclovir  (VALTREX ) 500 MG tablet Take 500 mg by mouth 2 (two) times daily.   [DISCONTINUED] benzonatate  (TESSALON ) 100 MG capsule Take 2 capsules (200 mg total) by mouth every 8 (eight) hours.   [DISCONTINUED] nortriptyline (PAMELOR) 10 MG capsule Start Nortriptyline (Pamelor) 10 mg nightly for one week, then increase to 20 mg nightly   [DISCONTINUED] pregabalin  (LYRICA ) 50 MG capsule Take 1 capsule (50 mg total) by mouth 2 (two) times daily.   [DISCONTINUED] promethazine -dextromethorphan (PROMETHAZINE -DM) 6.25-15 MG/5ML syrup Take 5 mLs by mouth 4 (four) times daily as needed.   [DISCONTINUED] sertraline  (ZOLOFT ) 25 MG tablet Take 1 tablet by mouth once daily   No facility-administered encounter medications on file as of 07/02/2023.    Surgical History: Past  Surgical History:  Procedure Laterality Date   ANTERIOR CERVICAL DECOMP/DISCECTOMY FUSION N/A 08/19/2018   Procedure: ANTERIOR CERVICAL DECOMPRESSION/DISCECTOMY FUSION 2 LEVEL;  Surgeon: Bluford Standing, MD;  Location: [facility] ORS;  Service: Neurosurgery;  Laterality: N/A;   WISDOM TOOTH EXTRACTION      Medical History: Past Medical History:  Diagnosis Date   Epilepsy (HCC)    as a child    Family History: Family History  Problem Relation Age of Onset   Cancer Father     Social History   Socioeconomic History   Marital status: Married    Spouse name: Not on file   Number of children: Not on file   Years of education: Not on file   Highest education level: Not on file  Occupational History   Not on file  Tobacco Use   Smoking status: Never   Smokeless tobacco: Never  Vaping Use   Vaping status: Never Used  Substance and Sexual Activity   Alcohol use: Yes    Comment: rarely   Drug use: No   Sexual activity: Not on file  Other Topics Concern   Not on file  Social History Narrative   Not on file   Social Drivers of Health   Financial Resource Strain: Not on file  Food Insecurity: Not on file  Transportation Needs: Not on file  Physical Activity: Not on file  Stress: Not on file  Social Connections: Not on file  Intimate Partner Violence: Not on file      Review of Systems  Constitutional:  Positive for unexpected weight change. Negative for chills, fatigue  and fever.  HENT:  Negative for congestion, ear pain, mouth sores, postnasal drip, sinus pressure and sore throat.   Respiratory: Negative.  Negative for cough, chest tightness, shortness of breath and wheezing.   Cardiovascular: Negative.  Negative for chest pain and palpitations.  Genitourinary:  Negative for flank pain.  Musculoskeletal: Negative.   Neurological:  Negative for headaches.  Psychiatric/Behavioral: Negative.      Vital Signs: BP 124/86   Pulse 83   Temp 98.6 F (37 C)   Resp 16   Ht  6' (1.829 m)   Wt (!) 328 lb 3.2 oz (148.9 kg)   SpO2 97%   BMI 44.51 kg/m    Physical Exam Vitals reviewed.  Constitutional:      General: Chris Hart is not in acute distress.    Appearance: Normal appearance. Chris Hart is obese. Chris Hart is not ill-appearing.  HENT:     Head: Normocephalic and atraumatic.  Eyes:     Pupils: Pupils are equal, round, and reactive to light.  Cardiovascular:     Rate and Rhythm: Normal rate and regular rhythm.     Heart sounds: Normal heart sounds. No murmur heard.    No gallop.  Pulmonary:     Effort: Pulmonary effort is normal. No respiratory distress.     Breath sounds: No wheezing.  Neurological:     Mental Status: Chris Hart is alert and oriented to person, place, and time.  Psychiatric:        Mood and Affect: Mood normal.        Behavior: Behavior normal.        Assessment/Plan: 1. Pseudotumor cerebri (Primary) Continue eye drops as prescribed by ophthalmology and continue follow up with neurology as well.  - latanoprost (XALATAN) 0.005 % ophthalmic solution; 1 drop at bedtime.  2. Generalized anxiety disorder Sertraline  dose increased, follow up in 1 month  - sertraline  (ZOLOFT ) 50 MG tablet; Take 1 tablet (50 mg total) by mouth daily.  Dispense: 30 tablet; Refill: 3  3. Recurrent major depressive disorder, in full remission (HCC) Sertraline  dose increased, follow up in 1 month.  - sertraline  (ZOLOFT ) 50 MG tablet; Take 1 tablet (50 mg total) by mouth daily.  Dispense: 30 tablet; Refill: 3  4. Class 3 severe obesity due to excess calories with serious comorbidity and body mass index (BMI) of 40.0 to 44.9 in adult [facility]) Start zepbound  as prescribed, 2.5 mg weekly for 4 doses then increase to 5 mg weekly. Will require prior authorization.  - tirzepatide  (ZEPBOUND ) 2.5 MG/0.5ML Pen; Inject 2.5 mg into the skin once a week.  Dispense: 2 mL; Refill: 0 - tirzepatide  (ZEPBOUND ) 5 MG/0.5ML Pen; Inject 5 mg into the skin once a week.  Dispense: 2 mL; Refill:  5      General Counseling: Chris Hart verbalizes understanding of the findings of todays visit and agrees with plan of treatment. I have discussed any further diagnostic evaluation that may be needed or ordered today. We also reviewed his medications today. Chris Hart has been encouraged to call the office with any questions or concerns that should arise related to todays visit.    No orders of the defined types were placed in this encounter.   Meds ordered this encounter  Medications   tirzepatide  (ZEPBOUND ) 2.5 MG/0.5ML Pen    Sig: Inject 2.5 mg into the skin once a week.    Dispense:  2 mL    Refill:  0    Dx code E66.01, please send prior authorization request  asap. Increase to the 5 mg dose at the first 4 weeks.   tirzepatide  (ZEPBOUND ) 5 MG/0.5ML Pen    Sig: Inject 5 mg into the skin once a week.    Dispense:  2 mL    Refill:  5    Dx code E66.01, start 5 mg dose after 4 doses of 2.5 mg.   sertraline  (ZOLOFT ) 50 MG tablet    Sig: Take 1 tablet (50 mg total) by mouth daily.    Dispense:  30 tablet    Refill:  3    Note increased dose, please fill new script today    Return in about 1 month (around 08/02/2023) for F/U, Labs, eval new med, Sibyl Mikula PCP.   Total time spent:30 Minutes Time spent includes review of chart, medications, test results, and follow up plan with the patient.    Controlled Substance Database was reviewed by me.  This patient was seen by Mardy Maxin, FNP-C in collaboration with Dr. Sigrid Bathe as a part of collaborative care agreement.  Nimo Verastegui R. Maxin, MSN, FNP-C Internal medicine

## 2023-07-03 LAB — CBC WITH DIFFERENTIAL/PLATELET
Basophils Absolute: 0.1 10*3/uL (ref 0.0–0.2)
Basos: 1 %
EOS (ABSOLUTE): 0.3 10*3/uL (ref 0.0–0.4)
Eos: 4 %
Hematocrit: 41.9 % (ref 37.5–51.0)
Hemoglobin: 13.9 g/dL (ref 13.0–17.7)
Immature Grans (Abs): 0.1 10*3/uL (ref 0.0–0.1)
Immature Granulocytes: 1 %
Lymphocytes Absolute: 2.7 10*3/uL (ref 0.7–3.1)
Lymphs: 30 %
MCH: 30.5 pg (ref 26.6–33.0)
MCHC: 33.2 g/dL (ref 31.5–35.7)
MCV: 92 fL (ref 79–97)
Monocytes Absolute: 0.7 10*3/uL (ref 0.1–0.9)
Monocytes: 8 %
Neutrophils Absolute: 5.1 10*3/uL (ref 1.4–7.0)
Neutrophils: 56 %
Platelets: 300 10*3/uL (ref 150–450)
RBC: 4.56 x10E6/uL (ref 4.14–5.80)
RDW: 12.8 % (ref 11.6–15.4)
WBC: 8.9 10*3/uL (ref 3.4–10.8)

## 2023-07-03 LAB — COMPREHENSIVE METABOLIC PANEL
ALT: 23 [IU]/L (ref 0–44)
AST: 15 [IU]/L (ref 0–40)
Albumin: 4.3 g/dL (ref 4.1–5.1)
Alkaline Phosphatase: 88 [IU]/L (ref 44–121)
BUN/Creatinine Ratio: 13 (ref 9–20)
BUN: 14 mg/dL (ref 6–20)
Bilirubin Total: 0.6 mg/dL (ref 0.0–1.2)
CO2: 24 mmol/L (ref 20–29)
Calcium: 9.8 mg/dL (ref 8.7–10.2)
Chloride: 102 mmol/L (ref 96–106)
Creatinine, Ser: 1.04 mg/dL (ref 0.76–1.27)
Globulin, Total: 3 g/dL (ref 1.5–4.5)
Glucose: 85 mg/dL (ref 70–99)
Potassium: 4.1 mmol/L (ref 3.5–5.2)
Sodium: 138 mmol/L (ref 134–144)
Total Protein: 7.3 g/dL (ref 6.0–8.5)
eGFR: 94 mL/min/{1.73_m2} (ref >59)

## 2023-07-03 LAB — LIPID PANEL
Chol/HDL Ratio: 5.6 {ratio} — ABNORMAL HIGH (ref 0.0–5.0)
Cholesterol, Total: 197 mg/dL (ref 100–199)
HDL: 35 mg/dL — ABNORMAL LOW (ref >39)
LDL Chol Calc (NIH): 133 mg/dL — ABNORMAL HIGH (ref 0–99)
Triglycerides: 162 mg/dL — ABNORMAL HIGH (ref 0–149)
VLDL Cholesterol Cal: 29 mg/dL (ref 5–40)

## 2023-07-03 LAB — B12 AND FOLATE PANEL
Folate: 7.5 ng/mL (ref >3.0)
Vitamin B-12: 653 pg/mL (ref 232–1245)

## 2023-07-03 LAB — VITAMIN D 25 HYDROXY (VIT D DEFICIENCY, FRACTURES): Vit D, 25-Hydroxy: 19.5 ng/mL — ABNORMAL LOW (ref 30.0–100.0)

## 2023-07-23 DIAGNOSIS — H40009 Preglaucoma, unspecified, unspecified eye: Secondary | ICD-10-CM | POA: Diagnosis not present

## 2023-08-06 ENCOUNTER — Ambulatory Visit: Payer: BC Managed Care – PPO | Admitting: Nurse Practitioner

## 2023-08-06 ENCOUNTER — Encounter: Payer: Self-pay | Admitting: Nurse Practitioner

## 2023-08-06 VITALS — BP 120/88 | HR 103 | Temp 98.5°F | Resp 16 | Ht 72.0 in | Wt 317.4 lb

## 2023-08-06 DIAGNOSIS — R7301 Impaired fasting glucose: Secondary | ICD-10-CM | POA: Diagnosis not present

## 2023-08-06 DIAGNOSIS — E559 Vitamin D deficiency, unspecified: Secondary | ICD-10-CM

## 2023-08-06 DIAGNOSIS — E66813 Obesity, class 3: Secondary | ICD-10-CM

## 2023-08-06 DIAGNOSIS — Z6841 Body Mass Index (BMI) 40.0 and over, adult: Secondary | ICD-10-CM

## 2023-08-06 LAB — POCT GLYCOSYLATED HEMOGLOBIN (HGB A1C): Hemoglobin A1C: 5.4 % (ref 4.0–5.6)

## 2023-08-06 MED ORDER — VITAMIN D (ERGOCALCIFEROL) 1.25 MG (50000 UNIT) PO CAPS
50000.0000 [IU] | ORAL_CAPSULE | ORAL | 1 refills | Status: DC
Start: 2023-08-06 — End: 2024-01-21

## 2023-08-06 MED ORDER — TIRZEPATIDE-WEIGHT MANAGEMENT 5 MG/0.5ML ~~LOC~~ SOAJ: 5.0000 mg | SUBCUTANEOUS | 5 refills | Status: DC

## 2023-08-06 NOTE — Progress Notes (Signed)
 [facility] [address] [place], [place] [address]  Internal MEDICINE  Office Visit Note  Patient Name: Hart Hart  [id number]  [id number]  Date of Service: 08/06/2023  Chief Complaint  Patient presents with   Follow-up    HPI Hart Hart presents for a follow-up visit for weight loss, low vitamin D Zepbound was approved by insurance, patient has taken the first 4 weeks of the medication nad has lost 11 lbs so far, ready to go up to the 5 mg dose.  Low vitamin D Impaired fasting glucose, A1c checked but it was normal.    Current Medication: Outpatient Encounter Medications as of 08/06/2023  Medication Sig   latanoprost (XALATAN) 0.005 % ophthalmic solution 1 drop at bedtime.   sertraline (ZOLOFT) 50 MG tablet Take 1 tablet (50 mg total) by mouth daily.   valACYclovir (VALTREX) 500 MG tablet Take 500 mg by mouth 2 (two) times daily.   Vitamin D, Ergocalciferol, (DRISDOL) 1.25 MG (50000 UNIT) CAPS capsule Take 1 capsule (50,000 Units total) by mouth every 7 (seven) days.   [DISCONTINUED] tirzepatide (ZEPBOUND) 2.5 MG/0.5ML Pen Inject 2.5 mg into the skin once a week.   [DISCONTINUED] tirzepatide (ZEPBOUND) 5 MG/0.5ML Pen Inject 5 mg into the skin once a week.   tirzepatide (ZEPBOUND) 5 MG/0.5ML Pen Inject 5 mg into the skin once a week.   No facility-administered encounter medications on file as of 08/06/2023.    Surgical History: Past Surgical History:  Procedure Laterality Date   ANTERIOR CERVICAL DECOMP/DISCECTOMY FUSION N/A 08/19/2018   Procedure: ANTERIOR CERVICAL DECOMPRESSION/DISCECTOMY FUSION 2 LEVEL;  Surgeon: Chris Chris, MD;  Location: [facility] ORS;  Service: Neurosurgery;  Laterality: N/A;   WISDOM TOOTH EXTRACTION      Medical History: Past Medical History:  Diagnosis Date   Epilepsy (HCC)    as a child    Family History: Family History  Problem Relation Age of Onset   Cancer Father     Social History   Socioeconomic History   Marital  status: Married    Spouse name: Not on file   Number of children: Not on file   Years of education: Not on file   Highest education level: Not on file  Occupational History   Not on file  Tobacco Use   Smoking status: Never   Smokeless tobacco: Never  Vaping Use   Vaping status: Never Used  Substance and Sexual Activity   Alcohol use: Yes    Comment: rarely   Drug use: No   Sexual activity: Not on file  Other Topics Concern   Not on file  Social History Narrative   Not on file   Social Drivers of Health   Financial Resource Strain: Not on file  Food Insecurity: Not on file  Transportation Needs: Not on file  Physical Activity: Not on file  Stress: Not on file  Social Connections: Not on file  Intimate Partner Violence: Not on file      Review of Systems  Constitutional:  Positive for unexpected weight change. Negative for chills, fatigue and fever.  HENT:  Negative for congestion, ear pain, mouth sores, postnasal drip, sinus pressure and sore throat.   Respiratory: Negative.  Negative for cough, chest tightness, shortness of breath and wheezing.   Cardiovascular: Negative.  Negative for chest pain and palpitations.  Genitourinary:  Negative for flank pain.  Musculoskeletal: Negative.   Neurological:  Negative for headaches.  Psychiatric/Behavioral: Negative.      Vital Signs: BP  120/88   Pulse (!) 103   Temp 98.5 F (36.9 C)   Resp 16   Ht 6' (1.829 m)   Wt (!) 317 lb 6.4 oz (144 kg)   SpO2 98%   BMI 43.05 kg/m    Physical Exam Vitals reviewed.  Constitutional:      General: He is not in acute distress.    Appearance: Normal appearance. He is obese. He is not ill-appearing.  HENT:     Head: Normocephalic and atraumatic.  Eyes:     Pupils: Pupils are equal, round, and reactive to light.  Cardiovascular:     Rate and Rhythm: Normal rate and regular rhythm.     Heart sounds: Normal heart sounds. No murmur heard.    No gallop.  Pulmonary:      Effort: Pulmonary effort is normal. No respiratory distress.     Breath sounds: No wheezing.  Neurological:     Mental Status: He is alert and oriented to person, place, and time.  Psychiatric:        Mood and Affect: Mood normal.        Behavior: Behavior normal.        Assessment/Plan: 1. Impaired fasting glucose (Primary) A1c is normal, will continue to monitor periodically - POCT glycosylated hemoglobin (Hb A1C)  2. Vitamin D deficiency Take 1 capsule every 7 days for low vitamin D  - Vitamin D, Ergocalciferol, (DRISDOL) 1.25 MG (50000 UNIT) CAPS capsule; Take 1 capsule (50,000 Units total) by mouth every 7 (seven) days.  Dispense: 12 capsule; Refill: 1  3. Class 3 severe obesity due to excess calories with serious comorbidity and body mass index (BMI) of 40.0 to 44.9 in adult Hart Hart) Continue zepbound as prescribed. Follow up in a few months  - tirzepatide (ZEPBOUND) 5 MG/0.5ML Pen; Inject 5 mg into the skin once a week.  Dispense: 2 mL; Refill: 5   General Counseling: Hart Hart verbalizes understanding of the findings of todays visit and agrees with plan of treatment. I have discussed any further diagnostic evaluation that may be needed or ordered today. We also reviewed his medications today. he has been encouraged to call the office with any questions or concerns that should arise related to todays visit.    Orders Placed This Encounter  Procedures   POCT glycosylated hemoglobin (Hb A1C)    Meds ordered this encounter  Medications   tirzepatide (ZEPBOUND) 5 MG/0.5ML Pen    Sig: Inject 5 mg into the skin once a week.    Dispense:  2 mL    Refill:  5    Dx code E66.01, discontinue 2.5 mg dose and fill new script today   Vitamin D, Ergocalciferol, (DRISDOL) 1.25 MG (50000 UNIT) CAPS capsule    Sig: Take 1 capsule (50,000 Units total) by mouth every 7 (seven) days.    Dispense:  12 capsule    Refill:  1    Fill new script daily.    Return in about 3 months (around  11/03/2023) for F/U, Weight loss, Hart Hart PCP.   Total time spent:30 Minutes Time spent includes review of chart, medications, test results, and follow up plan with the patient.   [place] Controlled Substance Database was reviewed by me.  This patient was seen by Sallyanne Kuster, FNP-C in collaboration with Dr. Beverely Risen as a part of collaborative care agreement.   Tarica Harl R. Tedd Sias, MSN, FNP-C Internal medicine

## 2023-10-26 ENCOUNTER — Emergency Department
Admission: EM | Admit: 2023-10-26 | Discharge: 2023-10-26 | Disposition: A | Discharge status: Home / Self Care | Attending: Emergency Medicine | Admitting: Emergency Medicine

## 2023-10-26 ENCOUNTER — Emergency Department

## 2023-10-26 ENCOUNTER — Other Ambulatory Visit: Payer: Self-pay

## 2023-10-26 DIAGNOSIS — M5459 Other low back pain: Secondary | ICD-10-CM | POA: Diagnosis not present

## 2023-10-26 DIAGNOSIS — M545 Low back pain, unspecified: Secondary | ICD-10-CM | POA: Diagnosis not present

## 2023-10-26 DIAGNOSIS — R109 Unspecified abdominal pain: Secondary | ICD-10-CM | POA: Diagnosis not present

## 2023-10-26 DIAGNOSIS — M549 Dorsalgia, unspecified: Secondary | ICD-10-CM | POA: Diagnosis not present

## 2023-10-26 LAB — CBC WITH DIFFERENTIAL/PLATELET
Abs Immature Granulocytes: 0.04 10*3/uL (ref 0.00–0.07)
Basophils Absolute: 0.1 10*3/uL (ref 0.0–0.1)
Basophils Relative: 1 %
Eosinophils Absolute: 0.5 10*3/uL (ref 0.0–0.5)
Eosinophils Relative: 5 %
HCT: 37.8 % — ABNORMAL LOW (ref 39.0–52.0)
Hemoglobin: 12.9 g/dL — ABNORMAL LOW (ref 13.0–17.0)
Immature Granulocytes: 0 %
Lymphocytes Relative: 28 %
Lymphs Abs: 2.6 10*3/uL (ref 0.7–4.0)
MCH: 30.3 pg (ref 26.0–34.0)
MCHC: 34.1 g/dL (ref 30.0–36.0)
MCV: 88.7 fL (ref 80.0–100.0)
Monocytes Absolute: 0.6 10*3/uL (ref 0.1–1.0)
Monocytes Relative: 7 %
Neutro Abs: 5.5 10*3/uL (ref 1.7–7.7)
Neutrophils Relative %: 59 %
Platelets: 284 10*3/uL (ref 150–400)
RBC: 4.26 MIL/uL (ref 4.22–5.81)
RDW: 13.2 % (ref 11.5–15.5)
WBC: 9.3 10*3/uL (ref 4.0–10.5)
nRBC: 0 % (ref 0.0–0.2)

## 2023-10-26 LAB — URINALYSIS, W/ REFLEX TO CULTURE (INFECTION SUSPECTED)
Bacteria, UA: NONE SEEN
Bilirubin Urine: NEGATIVE
Glucose, UA: NEGATIVE mg/dL
Hgb urine dipstick: NEGATIVE
Ketones, ur: NEGATIVE mg/dL
Leukocytes,Ua: NEGATIVE
Nitrite: NEGATIVE
Protein, ur: NEGATIVE mg/dL
Specific Gravity, Urine: 1.011 (ref 1.005–1.030)
Squamous Epithelial / HPF: 0 /HPF (ref 0–5)
pH: 7 (ref 5.0–8.0)

## 2023-10-26 LAB — COMPREHENSIVE METABOLIC PANEL WITH GFR
ALT: 20 U/L (ref 0–44)
AST: 17 U/L (ref 15–41)
Albumin: 3.7 g/dL (ref 3.5–5.0)
Alkaline Phosphatase: 58 U/L (ref 38–126)
Anion gap: 10 (ref 5–15)
BUN: 9 mg/dL (ref 6–20)
CO2: 25 mmol/L (ref 22–32)
Calcium: 9 mg/dL (ref 8.9–10.3)
Chloride: 101 mmol/L (ref 98–111)
Creatinine, Ser: 0.8 mg/dL (ref 0.61–1.24)
GFR, Estimated: >60 mL/min (ref >60)
Glucose, Bld: 94 mg/dL (ref 70–99)
Potassium: 3.7 mmol/L (ref 3.5–5.1)
Sodium: 136 mmol/L (ref 135–145)
Total Bilirubin: 1 mg/dL (ref 0.0–1.2)
Total Protein: 7.2 g/dL (ref 6.5–8.1)

## 2023-10-26 LAB — LIPASE, BLOOD: Lipase: 23 U/L (ref 11–51)

## 2023-10-26 MED ORDER — KETOROLAC TROMETHAMINE 15 MG/ML IJ SOLN
15.0000 mg | Freq: Once | INTRAMUSCULAR | Status: AC
  Administered 2023-10-26: 15 mg via INTRAVENOUS
  Filled 2023-10-26: qty 1

## 2023-10-26 NOTE — ED Triage Notes (Signed)
 Pt comes with back pain. Pt states right shoulder blade and radiates out. Pt states 7/10 pain.

## 2023-10-26 NOTE — ED Provider Notes (Signed)
 [facility] Provider Note    Event Date/Time   First MD Initiated Contact with Patient 10/26/23 1145     (approximate)   History   Chief Complaint: Back Pain   HPI  Chris Hart is a 38 y.o. male with no significant past medical history who comes ED complaining of right flank pain that was present on awakening this morning.  Worse with movement and change of position.  Nonradiating.  7/10 in intensity.  No dysuria frequency urgency or fever.  Normal oral intake without vomiting.  Denies any trauma or recent strenuous activity.        Past Medical History:  Diagnosis Date   Epilepsy [facility])    as a child    Current Outpatient Rx   Order #: [id number] Class: Historical Med   Order #: [id number] Class: Normal   Order #: [id number] Class: Normal   Order #: [id number] Class: Historical Med   Order #: [id number] Class: Normal    Past Surgical History:  Procedure Laterality Date   ANTERIOR CERVICAL DECOMP/DISCECTOMY FUSION N/A 08/19/2018   Procedure: ANTERIOR CERVICAL DECOMPRESSION/DISCECTOMY FUSION 2 LEVEL;  Surgeon: Berta Brittle, MD;  Location: [facility] ORS;  Service: Neurosurgery;  Laterality: N/A;   WISDOM TOOTH EXTRACTION      Physical Exam   Triage Vital Signs: ED Triage Vitals [10/26/23 1137]  Encounter Vitals Group     BP 115/77     Systolic BP Percentile      Diastolic BP Percentile      Pulse Rate 74     Resp 18     Temp 98.6 F (37 C)     Temp src      SpO2 99 %     Weight      Height      Head Circumference      Peak Flow      Pain Score 7     Pain Loc      Pain Education      Exclude from Growth Chart     Most recent vital signs: Vitals:   10/26/23 1400 10/26/23 1430  BP: 108/66 107/83  Pulse: 67 70  Resp:  18  Temp:    SpO2:  100%    General: Awake, no distress.  CV:  Good peripheral perfusion.  Regular rate rhythm, normal distal pulses Resp:  Normal effort.  Clear to auscultation bilaterally Abd:  No distention.   Soft nontender.  No CVA tenderness Other:  No midline spinal tenderness.  Mild tenderness in the right lumbar musculature but not specifically reproducing his pain.   ED Results / Procedures / Treatments   Labs (all labs ordered are listed, but only abnormal results are displayed) Labs Reviewed  CBC WITH DIFFERENTIAL/PLATELET - Abnormal; Notable for the following components:      Result Value   Hemoglobin 12.9 (*)    HCT 37.8 (*)    All other components within normal limits  URINALYSIS, W/ REFLEX TO CULTURE (INFECTION SUSPECTED) - Abnormal; Notable for the following components:   Color, Urine YELLOW (*)    APPearance CLEAR (*)    All other components within normal limits  COMPREHENSIVE METABOLIC PANEL WITH GFR  LIPASE, BLOOD     EKG    RADIOLOGY X-ray KUB unremarkable, no evidence of bowel obstruction or radiopaque renal calculus.  Radiology report reviewed   PROCEDURES:  Procedures   MEDICATIONS ORDERED IN ED: Medications  ketorolac  (TORADOL ) 15 MG/ML injection 15 mg (15 mg Intravenous Given 10/26/23 1234)  IMPRESSION / MDM / ASSESSMENT AND PLAN / ED COURSE  I reviewed the triage vital signs and the nursing notes.  DDx: Ureterolithiasis, lumbar muscle spasm, cystitis, choledocholithiasis, pancreatitis  Patient's presentation is most consistent with acute presentation with potential threat to life or bodily function.  Patient presents with right flank pain, with benign exam.  Vital signs are unremarkable, nontoxic, not in distress.  Doubt aortic dissection or aneurysm.  Most likely muscular, but will investigate for biliary or urinary causes due to nonlocalizing exam.    ----------------------------------------- 3:23 PM on 10/26/2023 ----------------------------------------- Labs normal.  Patient feeling better.  I do not think imaging will be fruitful right now, stable for discharge     FINAL CLINICAL IMPRESSION(S) / ED DIAGNOSES   Final diagnoses:   Acute right-sided low back pain without sciatica     Rx / DC Orders   ED Discharge Orders     None        Note:  This document was prepared using Dragon voice recognition software and may include unintentional dictation errors.   Jacquie Maudlin, MD 10/26/23 [phone]

## 2023-10-26 NOTE — ED Notes (Signed)
 Pt unable to urinate at this time. Pt asking for water. Per Dr. Vicenta Graft patient can drink to help urinate. Pt made aware and given a cup of water.

## 2023-10-26 NOTE — ED Notes (Signed)
Pt in XRAY 

## 2023-11-05 ENCOUNTER — Ambulatory Visit: Payer: BC Managed Care – PPO | Admitting: Nurse Practitioner

## 2023-12-17 ENCOUNTER — Other Ambulatory Visit: Payer: Self-pay | Admitting: Internal Medicine

## 2023-12-17 DIAGNOSIS — F321 Major depressive disorder, single episode, moderate: Secondary | ICD-10-CM

## 2023-12-27 ENCOUNTER — Other Ambulatory Visit: Payer: Self-pay

## 2023-12-27 DIAGNOSIS — F3342 Major depressive disorder, recurrent, in full remission: Secondary | ICD-10-CM

## 2023-12-27 DIAGNOSIS — F411 Generalized anxiety disorder: Secondary | ICD-10-CM

## 2023-12-27 MED ORDER — SERTRALINE HCL 50 MG PO TABS: 50.0000 mg | ORAL_TABLET | Freq: Every day | ORAL | 0 refills | Status: DC

## 2023-12-31 ENCOUNTER — Encounter: Payer: BC Managed Care – PPO | Admitting: Nurse Practitioner

## 2024-01-15 ENCOUNTER — Telehealth: Payer: Self-pay | Admitting: Nurse Practitioner

## 2024-01-15 NOTE — Telephone Encounter (Signed)
 Left vm and sent mychart message to confirm 01/22/24 appointment-Toni

## 2024-01-20 ENCOUNTER — Other Ambulatory Visit: Payer: Self-pay | Admitting: Nurse Practitioner

## 2024-01-20 DIAGNOSIS — E559 Vitamin D deficiency, unspecified: Secondary | ICD-10-CM

## 2024-01-21 DIAGNOSIS — H40009 Preglaucoma, unspecified, unspecified eye: Secondary | ICD-10-CM | POA: Diagnosis not present

## 2024-01-22 ENCOUNTER — Encounter: Payer: Self-pay | Admitting: Nurse Practitioner

## 2024-01-22 ENCOUNTER — Ambulatory Visit (INDEPENDENT_AMBULATORY_CARE_PROVIDER_SITE_OTHER): Admitting: Nurse Practitioner

## 2024-01-22 VITALS — BP 130/74 | HR 85 | Temp 98.3°F | Resp 16 | Ht 72.0 in | Wt 283.0 lb

## 2024-01-22 DIAGNOSIS — E66812 Obesity, class 2: Secondary | ICD-10-CM

## 2024-01-22 DIAGNOSIS — Z0001 Encounter for general adult medical examination with abnormal findings: Secondary | ICD-10-CM | POA: Diagnosis not present

## 2024-01-22 DIAGNOSIS — Z6838 Body mass index (BMI) 38.0-38.9, adult: Secondary | ICD-10-CM

## 2024-01-22 DIAGNOSIS — F3342 Major depressive disorder, recurrent, in full remission: Secondary | ICD-10-CM

## 2024-01-22 DIAGNOSIS — E782 Mixed hyperlipidemia: Secondary | ICD-10-CM | POA: Insufficient documentation

## 2024-01-22 DIAGNOSIS — F411 Generalized anxiety disorder: Secondary | ICD-10-CM | POA: Diagnosis not present

## 2024-01-22 DIAGNOSIS — F331 Major depressive disorder, recurrent, moderate: Secondary | ICD-10-CM

## 2024-01-22 DIAGNOSIS — E559 Vitamin D deficiency, unspecified: Secondary | ICD-10-CM | POA: Diagnosis not present

## 2024-01-22 MED ORDER — ZEPBOUND 7.5 MG/0.5ML ~~LOC~~ SOAJ: 7.5000 mg | SUBCUTANEOUS | 5 refills | Status: DC

## 2024-01-22 MED ORDER — SERTRALINE HCL 50 MG PO TABS
50.0000 mg | ORAL_TABLET | Freq: Every day | ORAL | 3 refills | Status: DC
Start: 2024-01-22 — End: 2024-04-22

## 2024-01-22 NOTE — Progress Notes (Addendum)
 [facility] [address] [place], [place] [address]  Internal MEDICINE  Office Visit Note  Patient Name: Chris Hart  [id number]  [id number]  Date of Service: 01/22/2024  Chief Complaint  Patient presents with   Annual Exam    HPI Legion presents for an annual well visit and physical exam.  Well-appearing 38 y.o. male with GAD, obesity, high cholesterol, and vitamin D  deficiency. He has a history of bell's palsy and idiopathic intracranial hypertension but these problems seem to be resolved. He is not regularly seeing neurology anymore.  Routine CRC screening: due in 5 years.  Labs: due to repeat cholesterol panel and vitamin D  level. New or worsening pain: none Other concerns: none  Down 45 lbs since last offce visit, on 5 mg zepbound     Current Medication: Outpatient Encounter Medications as of 01/22/2024  Medication Sig   tirzepatide  (ZEPBOUND ) 7.5 MG/0.5ML Pen Inject 7.5 mg into the skin once a week.   latanoprost (XALATAN) 0.005 % ophthalmic solution 1 drop at bedtime.   sertraline  (ZOLOFT ) 50 MG tablet Take 1 tablet (50 mg total) by mouth daily.   valACYclovir  (VALTREX ) 500 MG tablet Take 500 mg by mouth 2 (two) times daily.   Vitamin D , Ergocalciferol , (DRISDOL ) 1.25 MG (50000 UNIT) CAPS capsule Take 1 capsule by mouth once a week   [DISCONTINUED] sertraline  (ZOLOFT ) 50 MG tablet Take 1 tablet (50 mg total) by mouth daily.   [DISCONTINUED] tirzepatide  (ZEPBOUND ) 5 MG/0.5ML Pen Inject 5 mg into the skin once a week.   No facility-administered encounter medications on file as of 01/22/2024.    Surgical History: Past Surgical History:  Procedure Laterality Date   ANTERIOR CERVICAL DECOMP/DISCECTOMY FUSION N/A 08/19/2018   Procedure: ANTERIOR CERVICAL DECOMPRESSION/DISCECTOMY FUSION 2 LEVEL;  Surgeon: Bluford Standing, MD;  Location: [facility] ORS;  Service: Neurosurgery;  Laterality: N/A;   WISDOM TOOTH EXTRACTION      Medical History: Past Medical History:   Diagnosis Date   Epilepsy (HCC)    as a child    Family History: Family History  Problem Relation Age of Onset   Cancer Father     Social History   Socioeconomic History   Marital status: Married    Spouse name: Not on file   Number of children: Not on file   Years of education: Not on file   Highest education level: Not on file  Occupational History   Not on file  Tobacco Use   Smoking status: Never   Smokeless tobacco: Never  Vaping Use   Vaping status: Never Used  Substance and Sexual Activity   Alcohol use: Not Currently    Comment: rarely   Drug use: No   Sexual activity: Not on file  Other Topics Concern   Not on file  Social History Narrative   Not on file   Social Drivers of Health   Financial Resource Strain: Not on file  Food Insecurity: Not on file  Transportation Needs: Not on file  Physical Activity: Not on file  Stress: Not on file  Social Connections: Not on file  Intimate Partner Violence: Not on file      Review of Systems  Constitutional:  Positive for unexpected weight change. Negative for activity change, appetite change, chills, fatigue and fever.  HENT: Negative.  Negative for congestion, ear pain, rhinorrhea, sore throat and trouble swallowing.   Eyes: Negative.   Respiratory: Negative.  Negative for cough, chest tightness, shortness of breath and wheezing.   Cardiovascular:  Negative.  Negative for chest pain and palpitations.  Gastrointestinal: Negative.  Negative for abdominal pain, blood in stool, constipation, diarrhea, nausea and vomiting.  Endocrine: Negative.   Genitourinary: Negative.  Negative for difficulty urinating, dysuria, frequency, hematuria and urgency.  Musculoskeletal: Negative.  Negative for arthralgias, back pain, joint swelling, myalgias and neck pain.  Skin: Negative.  Negative for rash and wound.  Allergic/Immunologic: Negative.  Negative for immunocompromised state.  Neurological: Negative.  Negative for  dizziness, seizures, facial asymmetry, weakness, numbness and headaches.  Hematological: Negative.   Psychiatric/Behavioral: Negative.  Negative for behavioral problems, self-injury and suicidal ideas. The patient is not nervous/anxious.     Vital Signs: BP 130/74   Pulse 85   Temp 98.3 F (36.8 C)   Resp 16   Ht 6' (1.829 m)   Wt 283 lb (128.4 kg)   SpO2 97%   BMI 38.38 kg/m    Physical Exam Vitals reviewed.  Constitutional:      General: He is awake. He is not in acute distress.    Appearance: Normal appearance. He is well-developed and well-groomed. He is obese. He is not ill-appearing.  HENT:     Head: Normocephalic and atraumatic.     Right Ear: Tympanic membrane, ear canal and external ear normal.     Left Ear: Tympanic membrane, ear canal and external ear normal.     Nose: Nose normal. No congestion or rhinorrhea.     Mouth/Throat:     Mouth: Mucous membranes are moist.     Pharynx: Oropharynx is clear. No oropharyngeal exudate or posterior oropharyngeal erythema.  Eyes:     Conjunctiva/sclera: Conjunctivae normal.     Pupils: Pupils are equal, round, and reactive to light.  Cardiovascular:     Rate and Rhythm: Normal rate and regular rhythm.     Heart sounds: Normal heart sounds. No murmur heard. Pulmonary:     Effort: Pulmonary effort is normal. No respiratory distress.     Breath sounds: Normal breath sounds. No wheezing.  Abdominal:     General: Bowel sounds are normal.     Palpations: Abdomen is soft.  Musculoskeletal:        General: Normal range of motion.     Cervical back: Normal range of motion and neck supple.  Skin:    General: Skin is warm and dry.     Capillary Refill: Capillary refill takes less than 2 seconds.  Neurological:     General: No focal deficit present.     Mental Status: He is alert and oriented to person, place, and time.  Psychiatric:        Mood and Affect: Mood normal.        Behavior: Behavior normal. Behavior is  cooperative.        Assessment/Plan: 1. Encounter for routine adult health examination with abnormal findings (Primary) Age-appropriate preventive screenings and vaccinations discussed, annual physical exam completed. Routine labs for health maintenance ordered, see below. PHM updated.    2. Mixed hyperlipidemia Routine labs ordered  - Lipid Profile  3. Vitamin D  deficiency Routine lab ordered  - Vitamin D  (25 hydroxy)  4. Class 2 severe obesity due to excess calories with serious comorbidity and body mass index (BMI) of 38.0 to 38.9 in adult [facility]) Zepbound  dose is increased to 7.5 as of today. To help aid in continued weight loss.  - tirzepatide  (ZEPBOUND ) 7.5 MG/0.5ML Pen; Inject 7.5 mg into the skin once a week.  Dispense: 2 mL; Refill: 5  5. Generalized anxiety disorder Continue sertraline  as prescribed.  - sertraline  (ZOLOFT ) 50 MG tablet; Take 1 tablet (50 mg total) by mouth daily.  Dispense: 90 tablet; Refill: 3  6. Moderate episode of recurrent major depressive disorder (HCC) Continue sertraline  as prescribed.  - sertraline  (ZOLOFT ) 50 MG tablet; Take 1 tablet (50 mg total) by mouth daily.  Dispense: 90 tablet; Refill: 3     General Counseling: Smith verbalizes understanding of the findings of todays visit and agrees with plan of treatment. I have discussed any further diagnostic evaluation that may be needed or ordered today. We also reviewed his medications today. he has been encouraged to call the office with any questions or concerns that should arise related to todays visit.    Orders Placed This Encounter  Procedures   Lipid Profile   Vitamin D  (25 hydroxy)    Meds ordered this encounter  Medications   tirzepatide  (ZEPBOUND ) 7.5 MG/0.5ML Pen    Sig: Inject 7.5 mg into the skin once a week.    Dispense:  2 mL    Refill:  5    Note increased dose. Discontinue 5 mg dose and fill new script today.   sertraline  (ZOLOFT ) 50 MG tablet    Sig: Take 1 tablet  (50 mg total) by mouth daily.    Dispense:  90 tablet    Refill:  3    Note increased dose, please fill new script today    Return in about 3 months (around 04/23/2024) for F/U, Kiante Ciavarella PCP.   Total time spent:30 Minutes Time spent includes review of chart, medications, test results, and follow up plan with the patient.   [place] Controlled Substance Database was reviewed by me.  This patient was seen by Mardy Maxin, FNP-C in collaboration with Dr. Sigrid Bathe as a part of collaborative care agreement.  Dionicia Cerritos R. Maxin, MSN, FNP-C Internal medicine

## 2024-01-23 ENCOUNTER — Encounter: Payer: Self-pay | Admitting: Nurse Practitioner

## 2024-03-31 ENCOUNTER — Encounter: Payer: Self-pay | Admitting: Nurse Practitioner

## 2024-04-13 ENCOUNTER — Encounter: Payer: Self-pay | Admitting: Emergency Medicine

## 2024-04-13 ENCOUNTER — Ambulatory Visit
Admission: EM | Admit: 2024-04-13 | Discharge: 2024-04-13 | Disposition: A | Discharge status: Home / Self Care | Attending: Emergency Medicine | Admitting: Emergency Medicine

## 2024-04-13 DIAGNOSIS — H109 Unspecified conjunctivitis: Secondary | ICD-10-CM | POA: Diagnosis not present

## 2024-04-13 MED ORDER — MOXIFLOXACIN HCL 0.5 % OP SOLN: 1.0000 [drp] | Freq: Three times a day (TID) | OPHTHALMIC | 0 refills | Status: AC

## 2024-04-13 NOTE — ED Triage Notes (Signed)
 Patient reports a little irritation in both eyes that started this morning.

## 2024-04-13 NOTE — ED Provider Notes (Signed)
 [facility]    CSN: [id number] Arrival date & time: 04/13/24  1426      History   Chief Complaint Chief Complaint  Patient presents with   Eye Problem    HPI Chris Hart is a 38 y.o. male.   HPI  38 year old male with past medical history significant for generalized anxiety disorder, next hyperlipidemia,, and childhood epilepsy presents for evaluation of irritation to both of his eyes that started this morning.  His son has been experiencing mucousy discharge and irritation to his left eye for a day and the patient is concerned he may be developing pinkeye.  He has not had any discharge and he denies any changes in vision.  Past Medical History:  Diagnosis Date   Epilepsy [facility])    as a child    Patient Active Problem List   Diagnosis Date Noted   Mixed hyperlipidemia 01/22/2024   Vitamin D  deficiency 01/22/2024   Generalized anxiety disorder 01/22/2024   S/P cervical spinal fusion 08/19/2018    Past Surgical History:  Procedure Laterality Date   ANTERIOR CERVICAL DECOMP/DISCECTOMY FUSION N/A 08/19/2018   Procedure: ANTERIOR CERVICAL DECOMPRESSION/DISCECTOMY FUSION 2 LEVEL;  Surgeon: Bluford Standing, MD;  Location: [facility] ORS;  Service: Neurosurgery;  Laterality: N/A;   WISDOM TOOTH EXTRACTION         Home Medications    Prior to Admission medications   Medication Sig Start Date End Date Taking? Authorizing Provider  moxifloxacin (VIGAMOX) 0.5 % ophthalmic solution Place 1 drop into both eyes 3 (three) times daily for 7 days. 04/13/24 04/20/24 Yes Bernardino Ditch, NP  latanoprost (XALATAN) 0.005 % ophthalmic solution 1 drop at bedtime. 01/15/23   [provider]  sertraline  (ZOLOFT ) 50 MG tablet Take 1 tablet (50 mg total) by mouth daily. 01/22/24   Liana Fish, NP  valACYclovir  (VALTREX ) 500 MG tablet Take 500 mg by mouth 2 (two) times daily.    [provider]  Vitamin D , Ergocalciferol , (DRISDOL ) 1.25 MG (50000 UNIT) CAPS  capsule Take 1 capsule by mouth once a week 01/21/24   Liana Fish, NP    Family History Family History  Problem Relation Age of Onset   Cancer Father     Social History Social History   Tobacco Use   Smoking status: Never   Smokeless tobacco: Never  Vaping Use   Vaping status: Never Used  Substance Use Topics   Alcohol use: Not Currently    Comment: rarely   Drug use: No     Allergies   Bactrim [sulfamethoxazole-trimethoprim]   Review of Systems Review of Systems  Constitutional:  Negative for fever.  Eyes:  Positive for itching. Negative for photophobia, pain, discharge, redness and visual disturbance.     Physical Exam Triage Vital Signs ED Triage Vitals  Encounter Vitals Group     BP 04/13/24 1504 117/81     Girls Systolic BP Percentile --      Girls Diastolic BP Percentile --      Boys Systolic BP Percentile --      Boys Diastolic BP Percentile --      Pulse Rate 04/13/24 1504 81     Resp 04/13/24 1504 16     Temp 04/13/24 1504 98.3 F (36.8 C)     Temp Source 04/13/24 1504 Oral     SpO2 04/13/24 1504 95 %     Weight 04/13/24 1503 283 lb 1.1 oz (128.4 kg)     Height 04/13/24 1503 6' (1.829 m)  Head Circumference --      Peak Flow --      Pain Score 04/13/24 1503 0     Pain Loc --      Pain Education --      Exclude from Growth Chart --    No data found.  Updated Vital Signs BP 117/81 (BP Location: Right Arm)   Pulse 81   Temp 98.3 F (36.8 C) (Oral)   Resp 16   Ht 6' (1.829 m)   Wt 283 lb 1.1 oz (128.4 kg)   SpO2 95%   BMI 38.39 kg/m   Visual Acuity Right Eye Distance: 20/25 corrected Left Eye Distance: 20/25 corrected Bilateral Distance: 20/25 corrected  Right Eye Near:   Left Eye Near:    Bilateral Near:     Physical Exam Vitals and nursing note reviewed.  Constitutional:      Appearance: Normal appearance. He is not ill-appearing.  HENT:     Head: Normocephalic and atraumatic.  Eyes:     General: No scleral  icterus.       Right eye: No discharge.        Left eye: No discharge.     Extraocular Movements: Extraocular movements intact.     Conjunctiva/sclera: Conjunctivae normal.     Pupils: Pupils are equal, round, and reactive to light.  Skin:    General: Skin is warm and dry.     Capillary Refill: Capillary refill takes less than 2 seconds.     Findings: No rash.  Neurological:     General: No focal deficit present.     Mental Status: He is alert and oriented to person, place, and time.      UC Treatments / Results  Labs (all labs ordered are listed, but only abnormal results are displayed) Labs Reviewed - No data to display  EKG   Radiology No results found.  Procedures Procedures (including critical care time)  Medications Ordered in UC Medications - No data to display  Initial Impression / Assessment and Plan / UC Course  I have reviewed the triage vital signs and the nursing notes.  Pertinent labs & imaging results that were available during my care of the patient were reviewed by me and considered in my medical decision making (see chart for details).   Patient is a pleasant, nontoxic-appearing 38 year old male presenting for evaluation of bilateral eye irritation that started this morning.  As you can see the images above, the bulbar conjunctiva are unremarkable.  The labral conjunctiva are likewise unremarkable.  Patient has a normal red light reflex in both eyes and both pupils are equal round and reactive.  However, his son, whom he has been in close contact with, is experiencing bacterial conjunctivitis.  The patient reports that his eyes have felt similar in the past when he has developed conjunctivitis so I will discharge him home preemptively on Vigamox 3 times daily x 7 days.   Final Clinical Impressions(s) / UC Diagnoses   Final diagnoses:  Conjunctivitis of both eyes, unspecified conjunctivitis type     Discharge Instructions      Instill 1 drop of  Vigamox in each eye every 8 hours for the next 7 days for treatment of your conjunctivitis.  Avoid touching your eyes as much as possible.  Wipe down all surfaces, countertops, and doorknobs after the first and second 24 hours on eyedrops.  Wash her face with a clean wash rag to remove any drainage and use a different portion  of the wash rag to clean each eye so as to not reinfect yourself.  Return for reevaluation for any new or worsening symptoms.      ED Prescriptions     Medication Sig Dispense Auth. Provider   moxifloxacin (VIGAMOX) 0.5 % ophthalmic solution Place 1 drop into both eyes 3 (three) times daily for 7 days. 3 mL Bernardino Ditch, NP      PDMP not reviewed this encounter.   Bernardino Ditch, NP 04/13/24 1524

## 2024-04-13 NOTE — Discharge Instructions (Signed)
 Instill 1 drop of Vigamox in each eye every 8 hours for the next 7 days for treatment of your conjunctivitis.  Avoid touching your eyes as much as possible.  Wipe down all surfaces, countertops, and doorknobs after the first and second 24 hours on eyedrops.  Wash her face with a clean wash rag to remove any drainage and use a different portion of the wash rag to clean each eye so as to not reinfect yourself.  Return for reevaluation for any new or worsening symptoms.

## 2024-04-15 ENCOUNTER — Other Ambulatory Visit: Payer: Self-pay | Admitting: Nurse Practitioner

## 2024-04-15 DIAGNOSIS — E559 Vitamin D deficiency, unspecified: Secondary | ICD-10-CM

## 2024-04-22 ENCOUNTER — Ambulatory Visit (INDEPENDENT_AMBULATORY_CARE_PROVIDER_SITE_OTHER): Admitting: Nurse Practitioner

## 2024-04-22 ENCOUNTER — Telehealth: Payer: Self-pay | Admitting: Nurse Practitioner

## 2024-04-22 ENCOUNTER — Encounter: Payer: Self-pay | Admitting: Nurse Practitioner

## 2024-04-22 VITALS — BP 114/84 | HR 83 | Temp 97.6°F | Resp 16 | Ht 72.0 in | Wt 302.6 lb

## 2024-04-22 DIAGNOSIS — F411 Generalized anxiety disorder: Secondary | ICD-10-CM | POA: Diagnosis not present

## 2024-04-22 DIAGNOSIS — G4710 Hypersomnia, unspecified: Secondary | ICD-10-CM

## 2024-04-22 DIAGNOSIS — G479 Sleep disorder, unspecified: Secondary | ICD-10-CM | POA: Diagnosis not present

## 2024-04-22 DIAGNOSIS — F331 Major depressive disorder, recurrent, moderate: Secondary | ICD-10-CM

## 2024-04-22 DIAGNOSIS — J069 Acute upper respiratory infection, unspecified: Secondary | ICD-10-CM | POA: Diagnosis not present

## 2024-04-22 DIAGNOSIS — Z6841 Body Mass Index (BMI) 40.0 and over, adult: Secondary | ICD-10-CM

## 2024-04-22 DIAGNOSIS — G4719 Other hypersomnia: Secondary | ICD-10-CM

## 2024-04-22 MED ORDER — SERTRALINE HCL 100 MG PO TABS: 100.0000 mg | ORAL_TABLET | Freq: Every day | ORAL | 3 refills | Status: AC

## 2024-04-22 MED ORDER — PHENTERMINE HCL 37.5 MG PO TABS: 37.5000 mg | ORAL_TABLET | Freq: Every day | ORAL | 1 refills | Status: DC

## 2024-04-22 MED ORDER — AZITHROMYCIN 250 MG PO TABS: ORAL_TABLET | ORAL | 0 refills | Status: AC

## 2024-04-22 NOTE — Progress Notes (Signed)
 [facility] [address] [place], [place] [address]  Internal MEDICINE  Office Visit Note  Patient Name: Chris Hart  [id number]  [id number]  Date of Service: 04/22/2024  Chief Complaint  Patient presents with   Follow-up    HPI [name] presents for a follow-up visit for URI symptoms, poor sleep,  Chest congestion started 4 days ago. Also reports cough, mucous, yellow drainage, sinus pressure/pain, runny nose, headache, sore throat. Denies any chest tightness, wheezing or SOB Poor sleep -- wakes up 3-4 times per night, witnessed episodes of apnea, gasping in his sleep, headaches first thing in the morning sometimes, fatigue, daytime napping sometimes, snoring. Sleep quality has been worsening over the past few months. Average sleep hours are after midnight - about 5 am give or take.  EPWORTH SLEEPINESS SCALE: Scale: (0)= no chance of dozing; (1)= slight chance of dozing; (2)= moderate chance of dozing; (3)= high chance of dozing Chance  Situation Sitting and reading: 0 Watching TV: 0 Sitting Inactive in public: 0 As a passenger in car: 1   Lying down to rest: 3 Sitting and talking: 0 Sitting quielty after lunch: 0 In a car, stopped in traffic: 0 TOTAL SCORE:   4 out of 24 STOP-BANG score of 5, high risk of OSA  3.   Generalized anxiety -- taking sertraline  which is helping to decrease his anxiety. 4.   Depressed mood has improved with sertraline . 5.   Morbid obesity -- his insurance will not cover GLP-1 for weight loss. Wants to try something else.     Current Medication: Outpatient Encounter Medications as of 04/22/2024  Medication Sig   azithromycin  (ZITHROMAX ) 250 MG tablet Take 2 tablets on day 1, then 1 tablet daily on days 2 through 5   phentermine (ADIPEX-P) 37.5 MG tablet Take 1 tablet (37.5 mg total) by mouth daily before breakfast.   sertraline  (ZOLOFT ) 100 MG tablet Take 1 tablet (100 mg total) by mouth daily.   Vitamin D , Ergocalciferol ,  (DRISDOL ) 1.25 MG (50000 UNIT) CAPS capsule Take 1 capsule by mouth once a week   [DISCONTINUED] latanoprost (XALATAN) 0.005 % ophthalmic solution 1 drop at bedtime. (Patient not taking: Reported on 04/22/2024)   [DISCONTINUED] sertraline  (ZOLOFT ) 50 MG tablet Take 1 tablet (50 mg total) by mouth daily.   [DISCONTINUED] valACYclovir  (VALTREX ) 500 MG tablet Take 500 mg by mouth 2 (two) times daily. (Patient not taking: Reported on 04/22/2024)   No facility-administered encounter medications on file as of 04/22/2024.    Surgical History: Past Surgical History:  Procedure Laterality Date   ANTERIOR CERVICAL DECOMP/DISCECTOMY FUSION N/A 08/19/2018   Procedure: ANTERIOR CERVICAL DECOMPRESSION/DISCECTOMY FUSION 2 LEVEL;  Surgeon: Bluford Standing, MD;  Location: [facility] ORS;  Service: Neurosurgery;  Laterality: N/A;   WISDOM TOOTH EXTRACTION      Medical History: Past Medical History:  Diagnosis Date   Epilepsy (HCC)    as a child    Family History: Family History  Problem Relation Age of Onset   Cancer Father     Social History   Socioeconomic History   Marital status: Married    Spouse name: Not on file   Number of children: Not on file   Years of education: Not on file   Highest education level: Not on file  Occupational History   Not on file  Tobacco Use   Smoking status: Never   Smokeless tobacco: Never  Vaping Use   Vaping status: Never Used  Substance and Sexual Activity  Alcohol use: Not Currently    Comment: rarely   Drug use: No   Sexual activity: Not on file  Other Topics Concern   Not on file  Social History Narrative   Not on file   Social Drivers of Health   Financial Resource Strain: Not on file  Food Insecurity: Not on file  Transportation Needs: Not on file  Physical Activity: Not on file  Stress: Not on file  Social Connections: Not on file  Intimate Partner Violence: Not on file      Review of Systems  Constitutional:  Positive for fatigue and  unexpected weight change. Negative for chills and fever.  HENT:  Negative for congestion, ear pain, mouth sores, postnasal drip, sinus pressure and sore throat.   Respiratory: Negative.  Negative for cough, chest tightness, shortness of breath and wheezing.   Cardiovascular: Negative.  Negative for chest pain and palpitations.  Gastrointestinal: Negative.   Genitourinary:  Negative for flank pain.  Musculoskeletal:  Positive for arthralgias and back pain.  Neurological:  Negative for headaches.  Psychiatric/Behavioral:  Positive for behavioral problems, decreased concentration and sleep disturbance. Negative for self-injury and suicidal ideas. The patient is nervous/anxious.     Vital Signs: BP 114/84   Pulse 83   Temp 97.6 F (36.4 C)   Resp 16   Ht 6' (1.829 m)   Wt (!) 302 lb 9.6 oz (137.3 kg)   SpO2 97%   BMI 41.04 kg/m    Physical Exam Vitals reviewed.  Constitutional:      General: He is not in acute distress.    Appearance: Normal appearance. He is obese. He is not ill-appearing.  HENT:     Head: Normocephalic and atraumatic.  Eyes:     Pupils: Pupils are equal, round, and reactive to light.  Cardiovascular:     Rate and Rhythm: Normal rate and regular rhythm.     Heart sounds: Normal heart sounds. No murmur heard.    No gallop.  Pulmonary:     Effort: Pulmonary effort is normal. No respiratory distress.     Breath sounds: No wheezing.  Neurological:     Mental Status: He is alert and oriented to person, place, and time.  Psychiatric:        Mood and Affect: Mood normal.        Behavior: Behavior normal.        Assessment/Plan: 1. Upper respiratory tract infection, unspecified type (Primary) Zpak prescribed, take until gone.  - azithromycin  (ZITHROMAX ) 250 MG tablet; Take 2 tablets on day 1, then 1 tablet daily on days 2 through 5  Dispense: 6 tablet; Refill: 0  2. Sleep disturbance Sleep study ordered  - PSG Sleep Study; Future  3. Morbid obesity  with BMI of 40.0-44.9, adult (HCC) Take phentermine as prescribed, follow up in 8 weeks for weigh in  - phentermine (ADIPEX-P) 37.5 MG tablet; Take 1 tablet (37.5 mg total) by mouth daily before breakfast.  Dispense: 30 tablet; Refill: 1  4. Generalized anxiety disorder Continue sertraline  as prescribed  - sertraline  (ZOLOFT ) 100 MG tablet; Take 1 tablet (100 mg total) by mouth daily.  Dispense: 30 tablet; Refill: 3  5. Moderate episode of recurrent major depressive disorder (HCC) Continue sertraline  as prescribed.  - sertraline  (ZOLOFT ) 100 MG tablet; Take 1 tablet (100 mg total) by mouth daily.  Dispense: 30 tablet; Refill: 3   General Counseling: Kona verbalizes understanding of the findings of todays visit and agrees with plan of  treatment. I have discussed any further diagnostic evaluation that may be needed or ordered today. We also reviewed his medications today. he has been encouraged to call the office with any questions or concerns that should arise related to todays visit.    Orders Placed This Encounter  Procedures   PSG Sleep Study    Meds ordered this encounter  Medications   sertraline  (ZOLOFT ) 100 MG tablet    Sig: Take 1 tablet (100 mg total) by mouth daily.    Dispense:  30 tablet    Refill:  3    Fill new script today, discontinue 50 mg dose   azithromycin  (ZITHROMAX ) 250 MG tablet    Sig: Take 2 tablets on day 1, then 1 tablet daily on days 2 through 5    Dispense:  6 tablet    Refill:  0    Fill new script today   phentermine (ADIPEX-P) 37.5 MG tablet    Sig: Take 1 tablet (37.5 mg total) by mouth daily before breakfast.    Dispense:  30 tablet    Refill:  1    Fill new script today    Return in about 8 weeks (around 06/17/2024) for F/U, eval new med, Thaily Hackworth PCP and will need to discuss sleep study once done . SABRA   Total time spent:30 Minutes Time spent includes review of chart, medications, test results, and follow up plan with the patient.   [place]  Controlled Substance Database was reviewed by me.  This patient was seen by Mardy Maxin, FNP-C in collaboration with Dr. Sigrid Bathe as a part of collaborative care agreement.   Dameir Gentzler R. Maxin, MSN, FNP-C Internal medicine

## 2024-04-22 NOTE — Telephone Encounter (Signed)
 Awaiting 04/22/24 office notes for SS order-Toni

## 2024-05-01 ENCOUNTER — Ambulatory Visit: Admission: EM | Admit: 2024-05-01 | Discharge: 2024-05-01 | Disposition: A | Discharge status: Home / Self Care

## 2024-05-01 DIAGNOSIS — G5602 Carpal tunnel syndrome, left upper limb: Secondary | ICD-10-CM

## 2024-05-01 NOTE — ED Provider Notes (Signed)
 [facility]    CSN: [id number] Arrival date & time: 05/01/24  0950      History   Chief Complaint Chief Complaint  Patient presents with   Wrist Pain    HPI Chris Hart is a 38 y.o. male.   HPI  38 year old male with past medical history significant for generalized anxiety disorder, vitamin D  deficiency, and mixed hyperlipidemia presents for evaluation of pain to his left wrist with numbness and tingling in his left 3rd, 4th, and 5th finger.  This started yesterday after he got off work.  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  Past Medical History:  Diagnosis Date   Epilepsy [facility])    as a child    Patient Active Problem List   Diagnosis Date Noted   Mixed hyperlipidemia 01/22/2024   Vitamin D  deficiency 01/22/2024   Generalized anxiety disorder 01/22/2024   S/P cervical spinal fusion 08/19/2018    Past Surgical History:  Procedure Laterality Date   ANTERIOR CERVICAL DECOMP/DISCECTOMY FUSION N/A 08/19/2018   Procedure: ANTERIOR CERVICAL DECOMPRESSION/DISCECTOMY FUSION 2 LEVEL;  Surgeon: Bluford Standing, MD;  Location: [facility] ORS;  Service: Neurosurgery;  Laterality: N/A;   WISDOM TOOTH EXTRACTION         Home Medications    Prior to Admission medications   Medication Sig Start Date End Date Taking? Authorizing Provider  phentermine (ADIPEX-P) 37.5 MG tablet Take 1 tablet (37.5 mg total) by mouth daily before breakfast. 04/22/24  Yes Abernathy, Alyssa, NP  sertraline  (ZOLOFT ) 100 MG tablet Take 1 tablet (100 mg total) by mouth daily. 04/22/24  Yes Abernathy, Mardy, NP  Vitamin D , Ergocalciferol , (DRISDOL ) 1.25 MG (50000 UNIT) CAPS capsule Take 1 capsule by mouth once a week 04/15/24   Liana Mardy, NP    Family History Family History  Problem Relation Age of Onset   Cancer Father     Social History Social History   Tobacco Use   Smoking status: Never   Smokeless tobacco: Never  Vaping Use   Vaping status:  Never Used  Substance Use Topics   Alcohol use: Not Currently    Comment: rarely   Drug use: No     Allergies   Bactrim [sulfamethoxazole-trimethoprim]   Review of Systems Review of Systems  Musculoskeletal:  Negative for joint swelling.  Skin:  Negative for color change.  Neurological:  Positive for numbness. Negative for weakness.     Physical Exam Triage Vital Signs ED Triage Vitals  Encounter Vitals Group     BP      Girls Systolic BP Percentile      Girls Diastolic BP Percentile      Boys Systolic BP Percentile      Boys Diastolic BP Percentile      Pulse      Resp      Temp      Temp src      SpO2      Weight      Height      Head Circumference      Peak Flow      Pain Score      Pain Loc      Pain Education      Exclude from Growth Chart    No data found.  Updated Vital Signs BP 116/81 (BP Location: Right Arm)   Pulse 86   Temp 97.8 F (36.6 C) (Oral)   Resp 18   SpO2 94%   Visual Acuity Right  Eye Distance:   Left Eye Distance:   Bilateral Distance:    Right Eye Near:   Left Eye Near:    Bilateral Near:     Physical Exam Vitals and nursing note reviewed.  Constitutional:      Appearance: Normal appearance. He is not ill-appearing.  Musculoskeletal:        General: Tenderness present. No swelling or signs of injury.  Skin:    General: Skin is warm and dry.     Capillary Refill: Capillary refill takes less than 2 seconds.     Findings: No bruising or erythema.  Neurological:     General: No focal deficit present.     Mental Status: He is alert and oriented to person, place, and time.     Sensory: No sensory deficit.      UC Treatments / Results  Labs (all labs ordered are listed, but only abnormal results are displayed) Labs Reviewed - No data to display  EKG   Radiology No results found.  Procedures Procedures (including critical care time)  Medications Ordered in UC Medications - No data to display  Initial  Impression / Assessment and Plan / UC Course  I have reviewed the triage vital signs and the nursing notes.  Pertinent labs & imaging results that were available during my care of the patient were reviewed by me and considered in my medical decision making (see chart for details).   Patient is a pleasant, nontoxic-appearing 38 year old male presenting for evaluation of acute onset left wrist pain as outlined HPI above.  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  He reports that he worked his normal shift yesterday without any difficulty but noticed the pain when he was on his way home.  He has tingling in the 3rd, 4th, and 5th finger.  Sensation is intact and he has full range of motion of his hand and wrist.  He has grip strength of 4/5 in the left hand.  Mild tenderness with palpation of the carpal bones as well as a positive Tinel's sign and positive Phalen's test.  I suspect that he is developing early carpal tunnel syndrome.  I will have staff fit him with a Velcro wrist splint to keep his wrist in neutral position for the next week.  I will have him use over-the-counter naproxen  and ibuprofen  to help with pain and inflammation.  I will give him home physical therapy exercises to perform as well.  If his symptoms do not improve he should follow-up with orthopedics.  I will give him contact information for Dr. Theophilus Czar with [facility].   Final Clinical Impressions(s) / UC Diagnoses   Final diagnoses:  Carpal tunnel syndrome of left wrist     Discharge Instructions      Wear the wrist brace at all times other than when bathing or washing your hands for the next week to keep your wrist in a neutral position and decrease inflammation.  You may remove the wrist brace once or twice a day to perform the exercises given in your discharge instructions.  Use over-the-counter ibuprofen  or Aleve  according to the  package instructions as needed for pain and inflammation.  You may apply ice to your wrist for 20 minutes at a time, 2-3 times a day, to help with pain and inflammation.  I would recommend doing this after you do the range of motion exercises.  If your symptoms do not  improve I would recommend that you follow-up with orthopedics.  I have given you the contact information for Dr. Theophilus Czar who is a hand and wrist specialist here in [facility].     ED Prescriptions   None    PDMP not reviewed this encounter.   Bernardino Ditch, NP 05/01/24 1059

## 2024-05-01 NOTE — Discharge Instructions (Addendum)
 Wear the wrist brace at all times other than when bathing or washing your hands for the next week to keep your wrist in a neutral position and decrease inflammation.  You may remove the wrist brace once or twice a day to perform the exercises given in your discharge instructions.  Use over-the-counter ibuprofen  or Aleve  according to the package instructions as needed for pain and inflammation.  You may apply ice to your wrist for 20 minutes at a time, 2-3 times a day, to help with pain and inflammation.  I would recommend doing this after you do the range of motion exercises.  If your symptoms do not improve I would recommend that you follow-up with orthopedics.  I have given you the contact information for Dr. Theophilus Czar who is a hand and wrist specialist here in [facility].

## 2024-05-01 NOTE — ED Triage Notes (Signed)
 Pt states he is having left wrist pain that started yesterday. No recent injuries or falls.

## 2024-05-02 ENCOUNTER — Telehealth: Payer: Self-pay | Admitting: Nurse Practitioner

## 2024-05-02 ENCOUNTER — Encounter: Payer: Self-pay | Admitting: Nurse Practitioner

## 2024-05-02 DIAGNOSIS — F331 Major depressive disorder, recurrent, moderate: Secondary | ICD-10-CM | POA: Insufficient documentation

## 2024-05-02 DIAGNOSIS — G479 Sleep disorder, unspecified: Secondary | ICD-10-CM | POA: Insufficient documentation

## 2024-05-02 NOTE — Telephone Encounter (Signed)
 SS order emailed to FG-Toni

## 2024-06-10 ENCOUNTER — Ambulatory Visit: Admitting: Nurse Practitioner

## 2024-06-10 ENCOUNTER — Encounter: Payer: Self-pay | Admitting: Nurse Practitioner

## 2024-06-10 VITALS — BP 116/94 | HR 112 | Temp 95.7°F | Resp 16 | Ht 72.0 in | Wt 296.0 lb

## 2024-06-10 DIAGNOSIS — R6889 Other general symptoms and signs: Secondary | ICD-10-CM

## 2024-06-10 DIAGNOSIS — R051 Acute cough: Secondary | ICD-10-CM

## 2024-06-10 DIAGNOSIS — Z6841 Body Mass Index (BMI) 40.0 and over, adult: Secondary | ICD-10-CM | POA: Diagnosis not present

## 2024-06-10 DIAGNOSIS — J069 Acute upper respiratory infection, unspecified: Secondary | ICD-10-CM

## 2024-06-10 LAB — POCT INFLUENZA A/B
Influenza A, POC: NEGATIVE
Influenza B, POC: NEGATIVE

## 2024-06-10 MED ORDER — PHENTERMINE HCL 37.5 MG PO TABS: 37.5000 mg | ORAL_TABLET | Freq: Every day | ORAL | 1 refills | Status: AC

## 2024-06-10 MED ORDER — HYDROCOD POLI-CHLORPHE POLI ER 10-8 MG/5ML PO SUER: 5.0000 mL | Freq: Two times a day (BID) | ORAL | 0 refills | Status: AC | PRN

## 2024-06-10 MED ORDER — DOXYCYCLINE HYCLATE 100 MG PO TABS: 100.0000 mg | ORAL_TABLET | Freq: Two times a day (BID) | ORAL | 0 refills | Status: AC

## 2024-06-10 NOTE — Progress Notes (Signed)
 [facility] [address] [place], [place] [address]  Internal MEDICINE  Office Visit Note  Patient Name: Chris Hart  [id number]  [id number]  Date of Service: 06/10/2024  Chief Complaint  Patient presents with   Acute Visit    Sunday body aches, low grade fever, headache, not eating.      HPI Chris Hart presents for an acute sick visit for not feeling well.  --onset of symptoms was 2 days ago. He reports body aches, fever, headache, lack of appetite, cough, weakness, sneezing, sinus pressure, sore throat, chills.  Unable to test for covid at home.  Tested for flu in the office today, it was negative   Weight loss -- down 6 lbs since his last office visit, wants to continue phentermine  for now.     Current Medication:  Outpatient Encounter Medications as of 06/10/2024  Medication Sig   chlorpheniramine-HYDROcodone  (TUSSIONEX) 10-8 MG/5ML Take 5 mLs by mouth every 12 (twelve) hours as needed for cough.   doxycycline  (VIBRA -TABS) 100 MG tablet Take 1 tablet (100 mg total) by mouth 2 (two) times daily for 10 days. On empty stomach.   phentermine  (ADIPEX-P ) 37.5 MG tablet Take 1 tablet (37.5 mg total) by mouth daily before breakfast.   sertraline  (ZOLOFT ) 100 MG tablet Take 1 tablet (100 mg total) by mouth daily.   Vitamin D , Ergocalciferol , (DRISDOL ) 1.25 MG (50000 UNIT) CAPS capsule Take 1 capsule by mouth once a week   [DISCONTINUED] phentermine  (ADIPEX-P ) 37.5 MG tablet Take 1 tablet (37.5 mg total) by mouth daily before breakfast.   No facility-administered encounter medications on file as of 06/10/2024.      Medical History: Past Medical History:  Diagnosis Date   Epilepsy (HCC)    as a child     Vital Signs: BP (!) 116/94   Pulse (!) 112   Temp (!) 95.7 F (35.4 C)   Resp 16   Ht 6' (1.829 m)   Wt 296 lb (134.3 kg)   SpO2 96%   BMI 40.14 kg/m    Review of Systems  Constitutional:  Positive for appetite change, chills, fatigue and  fever.  HENT:  Positive for postnasal drip, rhinorrhea, sinus pressure, sinus pain and sneezing. Negative for congestion, ear pain and sore throat.   Respiratory:  Positive for cough. Negative for chest tightness, shortness of breath and wheezing.   Cardiovascular: Negative.  Negative for chest pain and palpitations.  Gastrointestinal:  Positive for diarrhea. Negative for constipation, nausea and vomiting.  Musculoskeletal:  Positive for myalgias.  Neurological:  Positive for weakness and headaches.  Psychiatric/Behavioral:  Positive for sleep disturbance. Negative for self-injury and suicidal ideas. The patient is not nervous/anxious.     Physical Exam Vitals reviewed.  Constitutional:      General: He is not in acute distress.    Appearance: Normal appearance. He is obese. He is not ill-appearing.  HENT:     Head: Normocephalic and atraumatic.     Right Ear: Tympanic membrane, ear canal and external ear normal.     Left Ear: Tympanic membrane, ear canal and external ear normal.     Nose: Congestion and rhinorrhea present.     Mouth/Throat:     Mouth: Mucous membranes are moist.     Pharynx: Posterior oropharyngeal erythema present.  Eyes:     Pupils: Pupils are equal, round, and reactive to light.  Cardiovascular:     Rate and Rhythm: Normal rate and regular rhythm.     Heart  sounds: Normal heart sounds. No murmur heard. Pulmonary:     Effort: Pulmonary effort is normal. No respiratory distress.     Breath sounds: Normal breath sounds. No wheezing.  Lymphadenopathy:     Cervical: No cervical adenopathy.  Skin:    Capillary Refill: Capillary refill takes less than 2 seconds.  Neurological:     Mental Status: He is alert and oriented to person, place, and time.  Psychiatric:        Mood and Affect: Mood normal.        Behavior: Behavior normal.       Assessment/Plan: 1. Upper respiratory tract infection, unspecified type (Primary) Doxycycline  prescribed, take until  gone.  - doxycycline  (VIBRA -TABS) 100 MG tablet; Take 1 tablet (100 mg total) by mouth 2 (two) times daily for 10 days. On empty stomach.  Dispense: 20 tablet; Refill: 0  2. Acute cough Tussionex prescribed, take as needed.  - chlorpheniramine-HYDROcodone  (TUSSIONEX) 10-8 MG/5ML; Take 5 mLs by mouth every 12 (twelve) hours as needed for cough.  Dispense: 140 mL; Refill: 0  3. Flu-like symptoms Negative for flu A and B - POCT Influenza A/B  4. Morbid obesity with BMI of 40.0-44.9, adult (HCC) Down 6 lbs, continue phentermine  as prescribed, follow up in 8 weeks.  - phentermine  (ADIPEX-P ) 37.5 MG tablet; Take 1 tablet (37.5 mg total) by mouth daily before breakfast.  Dispense: 30 tablet; Refill: 1    General Counseling: Chris Hart verbalizes understanding of the findings of todays visit and agrees with plan of treatment. I have discussed any further diagnostic evaluation that may be needed or ordered today. We also reviewed his medications today. he has been encouraged to call the office with any questions or concerns that should arise related to todays visit.    Counseling:    Orders Placed This Encounter  Procedures   POCT Influenza A/B    Meds ordered this encounter  Medications   chlorpheniramine-HYDROcodone  (TUSSIONEX) 10-8 MG/5ML    Sig: Take 5 mLs by mouth every 12 (twelve) hours as needed for cough.    Dispense:  140 mL    Refill:  0    Fill new script today   doxycycline  (VIBRA -TABS) 100 MG tablet    Sig: Take 1 tablet (100 mg total) by mouth 2 (two) times daily for 10 days. On empty stomach.    Dispense:  20 tablet    Refill:  0    Fill new script today.   phentermine  (ADIPEX-P ) 37.5 MG tablet    Sig: Take 1 tablet (37.5 mg total) by mouth daily before breakfast.    Dispense:  30 tablet    Refill:  1    Fill new script today    Return in about 8 weeks (around 08/05/2024) for F/U, [place] Delio PCP reschedule next weeks appt .  [place] Controlled Substance Database was  reviewed by me for overdose risk score (ORS)  Time spent:30 Minutes Time spent with patient included reviewing progress notes, labs, imaging studies, and discussing plan for follow up.   This patient was seen by Mardy Maxin, FNP-C in collaboration with Dr. Sigrid Bathe as a part of collaborative care agreement.  Lauranne Beyersdorf R. Maxin, MSN, FNP-C Internal Medicine

## 2024-06-16 ENCOUNTER — Ambulatory Visit: Admitting: Nurse Practitioner

## 2024-07-01 ENCOUNTER — Encounter: Payer: Self-pay | Admitting: Internal Medicine

## 2024-07-01 DIAGNOSIS — G4733 Obstructive sleep apnea (adult) (pediatric): Secondary | ICD-10-CM | POA: Diagnosis not present

## 2024-07-03 ENCOUNTER — Encounter: Payer: Self-pay | Admitting: Internal Medicine

## 2024-07-03 DIAGNOSIS — G4733 Obstructive sleep apnea (adult) (pediatric): Secondary | ICD-10-CM | POA: Diagnosis not present

## 2024-07-04 ENCOUNTER — Telehealth: Payer: Self-pay | Admitting: Nurse Practitioner

## 2024-07-04 NOTE — Telephone Encounter (Signed)
 Per FG, patient to p/u HST 07/01/24 and return 07/03/24-Toni

## 2024-07-16 NOTE — Procedures (Signed)
 "   Study Date: 07/03/2024  Patient Name: Chris Hart, Chris Hart. Recording Device: Sharyne Cheers  Sex: Male Height: 72.0 in.  D.O.B.: Mar 21, 1986 Weight: 294.0 lbs.  Age: 39 years B.M.I: 39.9 lb./in2   Times and Durations  Lights off clock time:  12:05:50 AM Total Recording Time (TRT): 355.2 minutes  Lights on clock time: 6:01:02 AM Time In Bed (TIB): 355.2 minutes    Monitoring Time (MT): 353.7 minutes   Device and Sensor Details  The study was recorded on a [employer] device using 1 RIP effort belt and a pressure-based flow sensor. The heart rate is derived from the oximeter sensor and the snore signal is derived from the pressure sensor. The device also records body position and uses it to determine the monitoring time (sleep/wake periods).   Summary  REI 0.3 OAI 0.0 CAI 0.0 Lowest Desat 89  REI is the number of respiratory events per hour. OAI is the number of obstructive apneas per hour. CAI is the number of central apneas per hour. Lowest Desat is the lowest blood oxygen level that lasted at least 2 seconds. ESPIRATORY EVENTS   Index (#/hour) Total # of Events Mean duration  (sec) Max duration  (sec) # of Events by Position       Supine Prone Left Right Up  Central Apneas 0.0 0 0.0 0.0 0    0 0     Obstructive Apneas 0.0 0 0.0 0.0 0    0 0     Mixed Apneas 0.0 0 0.0 0.0 0    0 0     Hypopneas 0.3 2 21.0 29.5 2    0 0     Apneas + Hypopneas 0.3 2 21.0 29.5 2    0 0     RERAs 0.0 0 0.0 0.0 0    0 0     Total 0.3 2 21.0 29.5 2    0 0     Time in Position 244.2    46.3 63.2     REI in Position 0.5    0.0 0.0       Positional Summary   Supine Non-Supine  Total # of Events 2 0.00  Total Duration (minutes) 244.2 109.50  Sleep Duration (minutes) 244.2 109.50  REI in Position 0.5 0.00  REISupine / REINon-Supine Ratio 0.00   Positional Sleep Apnea is indicated if REI (supine) >= 2 x REI (non-supine) per Santo, Sleep. 1984;7(2).    Oximetry Summary   Dur.  (min) % TIB  <90 % 0.0 0.0  <85 % 0.0 0.0  <80 % 0.0 0.0  <70 % 0.0 0.0  Total Dur (min) < 89 0.0 min  Average (%) 94  Total # of Desats 53  Desat Index (#/hour) 9.3  Desat Max (%) 7  Desat Max dur (sec) 52.0  Lowest SpO2 % during sleep 89  Duration of Min SpO2 (sec) 7  Highest SpO2 % during sleep 98  Duration of Max SpO2(sec) 3    Heart Rate Stats  Mean HR during sleep 78.4 (BPM)  Highest HR during sleep 109  (BPM)  Highest HR during TIB  109 (BPM)  Lowest HR during sleep 63  (BPM)  Lowest HR during TIB 63 (BPM)    Snoring Summary  Total Snoring Episodes 4  Total Duration with Snoring 0.5 minutes  Mean Duration of Snoring 7.3 seconds  Percentage of Snoring 0.1 %  [facility]  Portable Polysomnogram Report Part 1 Phone: [phone] Fax: [phone]  Patient Name: Chris Hart. Recording Device: Sharyne Cheers  D.O.B.: 10/18/1985 Acquisition Number: [id number]  Referring Physician: Mardy Maxin, FNP-C Acquisition Date: 07/03/2024   History: The patient is a 39 year old male who was referred for re-evaluation of possible sleep apnea.  Medical History: anxiety, depression.  Medications: sertraline , phentermine , vitamin D .  PROCEDURE  The unattended portable polysomnogram was conducted on the night of 07/03/2024.  The following parameters  were monitored: Nasal and oral airflow, and body position. Additionally, thoracic and abdominal movements were  recorded by inductance plethysmography. Oxygen saturation (SpO2) and heart rate (ECG) was monitored using  a pulse oximeter.  The tracing was scored using 30 second epochs. Hypopneas were scored per AASM definition  VIII4.B (4% desaturation).   Description: The total recording time was 355.2 minutes. Sleep parameters are not recorded.  Respiratory monitoring demonstrated  snoring across the night in all positions. Only 2 respiratory events  were recorded. The lowest  oxygen desaturation associated with a respiratory event was 89 %.    Cardiac monitoring- The average heart rate during the recording was 78.4 bpm.  Impression: This repeat overnight portable polysomnogram did not demonstrate obstructive sleep apnea with only 2 respiratory  events recorded.  Recommendations:     Negative home studies do not necessarily rule out significant sleep apnea or other sleep disorders. If there is  clinical suspicion for sleep apnea or another sleep disorder a fully attended, in laboratory study is  recommended. 2. Would recommend weight loss in a patient with a BMI of 39.9 lb./in2.  Elfreda LABOR Bathe, MD [facility] Diplomate ABMS Pulmonary Critical Care and Sleep Medicine Electronically reviewed and digitally signed    "

## 2024-07-16 NOTE — Procedures (Signed)
 "   Study Date: 07/01/2024  Patient Name: Chris Hart, Chris Hart. Recording Device: Sharyne Cheers  Sex: Male Height: 72.0 in.  D.O.B.: [birth year] Weight: 294.0 lbs.  Age: 39 years B.M.I: 39.9 lb./in2   Times and Durations  Lights off clock time:  11:04:39 PM Total Recording Time (TRT): 391.3 minutes  Lights on clock time: 5:35:57 AM Time In Bed (TIB): 391.3 minutes    Monitoring Time (MT): 386.3 minutes   Device and Sensor Details  The study was recorded on a [employer] device using 1 RIP effort belt and a pressure-based flow sensor. The heart rate is derived from the oximeter sensor and the snore signal is derived from the pressure sensor. The device also records body position and uses it to determine the monitoring time (sleep/wake periods).   Summary  REI 0.6 OAI 0.6 CAI 0.0 Lowest Desat 90  REI is the number of respiratory events per hour. OAI is the number of obstructive apneas per hour. CAI is the number of central apneas per hour. Lowest Desat is the lowest blood oxygen level that lasted at least 2 seconds.  RESPIRATORY EVENTS   Index (#/hour) Total # of Events Mean duration  (sec) Max duration  (sec) # of Events by Position       Supine Prone Left Right Up  Central Apneas 0.0 0 0.0 0.0 0    0 0 0  Obstructive Apneas 0.6 4 12.3 13.5 4    0 0 0  Mixed Apneas 0.0 0 0.0 0.0 0    0 0 0  Hypopneas 0.0 0 0.0 0.0 0    0 0 0  Apneas + Hypopneas 0.6 4 12.3 13.5 4    0 0 0  RERAs 0.0 0 0.0 0.0 0    0 0 0  Total 0.6 4 12.3 13.5 4    0 0 0  Time in Position 244.3    0.1 142.0 0.3  REI in Position 1.0    0.0 0.0 0.0    Positional Summary   Supine Non-Supine  Total # of Events 4 0.00  Total Duration (minutes) 244.3 142.40  Sleep Duration (minutes) 244.2 142.10  REI in Position 1.0 0.00  REISupine / REINon-Supine Ratio 0.00   Positional Sleep Apnea is indicated if REI (supine) >= 2 x REI (non-supine) per Santo, Sleep. 1984;7(2).   Oximetry Summary   Dur.  (min) % TIB  <90 % 0.0 0.0  <85 % 0.0 0.0  <80 % 0.0 0.0  <70 % 0.0 0.0  Total Dur (min) < 89 0.0 min  Average (%) 94  Total # of Desats 57  Desat Index (#/hour) 8.9  Desat Max (%) 8  Desat Max dur (sec) 40.0  Lowest SpO2 % during sleep 90  Duration of Min SpO2 (sec) 12  Highest SpO2 % during sleep 99  Duration of Max SpO2(sec) 8    Heart Rate Stats  Mean HR during sleep 71.3 (BPM)  Highest HR during sleep 105  (BPM)  Highest HR during TIB  105 (BPM)  Lowest HR during sleep 59  (BPM)  Lowest HR during TIB 59 (BPM)    Snoring Summary  Total Snoring Episodes 8  Total Duration with Snoring 1.9 minutes  Mean Duration of Snoring 14.0 seconds  Percentage of Snoring 0.5 %                [facility]  Portable Polysomnogram Report Part 1 Phone: [phone] Fax: [phone])  [phone]  Patient Name: Chris Hart, Chris A. Recording Device: Sharyne Cheers  D.O.B.: [birth year] Acquisition Number: [id number]  Referring Physician: Mardy Maxin, FNP-C Acquisition Date: 07/01/2024   History: The patient is a 39 year old male who was referred for evaluation of possible sleep apnea.  Medical History: anxiety, depression.  Medications: sertraline , phentermine , vitamin D .  PROCEDURE  The unattended portable polysomnogram was conducted on the night of 07/01/2024.  The following parameters  were monitored: Nasal and oral airflow, and body position. Additionally, thoracic and abdominal movements were  recorded by inductance plethysmography. Oxygen saturation (SpO2) and heart rate (ECG) was monitored using  a pulse oximeter.  The tracing was scored using 30 second epochs. Hypopneas were scored per AASM definition  VIII4.B (4% desaturation).   Description: The total recording time was 391.3 minutes. Sleep parameters are not recorded.  Respiratory monitoring demonstrated  snoring across the night in all positions. Only 4 respiratory events were recorded. The lowest  oxygen desaturation associated with a respiratory event was 92 %.    Cardiac monitoring- The average heart rate during the recording was 71.3 bpm.  Impression: This routine overnight portable polysomnogram did not demonstrate obstructive sleep apnea with only 4 respiratory  events recorded.  Recommendations:     A negative home study does not necessarily rule out significant sleep apnea and a repeat study is  recommended. 2. Would recommend weight loss in a patient with a BMI of 39.9 lb./in2.  Elfreda DELENA Bathe, MD [facility] Diplomate ABMS Pulmonary Critical Care and Sleep Medicine Electronically reviewed and digitally signed "

## 2024-08-05 ENCOUNTER — Ambulatory Visit: Admitting: Nurse Practitioner

## 2025-01-22 ENCOUNTER — Encounter: Admitting: Nurse Practitioner
